# Patient Record
Sex: Female | Born: 1945 | Race: White | Hispanic: No | Marital: Married | Smoking: Never smoker
Health system: Southern US, Community
[De-identification: ages and names within clinical notes are randomized; demographics above are authoritative.]

## PROBLEM LIST (undated history)

## (undated) DIAGNOSIS — N189 Chronic kidney disease, unspecified: Secondary | ICD-10-CM

## (undated) DIAGNOSIS — R51 Headache: Secondary | ICD-10-CM

## (undated) DIAGNOSIS — B2790 Infectious mononucleosis, unspecified without complication: Secondary | ICD-10-CM

## (undated) DIAGNOSIS — R519 Headache, unspecified: Secondary | ICD-10-CM

## (undated) DIAGNOSIS — E785 Hyperlipidemia, unspecified: Secondary | ICD-10-CM

## (undated) DIAGNOSIS — E7212 Methylenetetrahydrofolate reductase deficiency: Secondary | ICD-10-CM

## (undated) DIAGNOSIS — R911 Solitary pulmonary nodule: Secondary | ICD-10-CM

## (undated) DIAGNOSIS — K219 Gastro-esophageal reflux disease without esophagitis: Secondary | ICD-10-CM

## (undated) DIAGNOSIS — J302 Other seasonal allergic rhinitis: Secondary | ICD-10-CM

## (undated) DIAGNOSIS — K5790 Diverticulosis of intestine, part unspecified, without perforation or abscess without bleeding: Secondary | ICD-10-CM

## (undated) DIAGNOSIS — R413 Other amnesia: Secondary | ICD-10-CM

## (undated) DIAGNOSIS — Z1589 Genetic susceptibility to other disease: Secondary | ICD-10-CM

## (undated) DIAGNOSIS — I2510 Atherosclerotic heart disease of native coronary artery without angina pectoris: Secondary | ICD-10-CM

## (undated) DIAGNOSIS — E079 Disorder of thyroid, unspecified: Secondary | ICD-10-CM

## (undated) HISTORY — DX: Disorder of thyroid, unspecified: E07.9

## (undated) HISTORY — PX: COLONOSCOPY: SHX174

## (undated) HISTORY — DX: Solitary pulmonary nodule: R91.1

## (undated) HISTORY — DX: Chronic kidney disease, unspecified: N18.9

## (undated) HISTORY — PX: CATARACT EXTRACTION, BILATERAL: SHX1313

## (undated) HISTORY — DX: Diverticulosis of intestine, part unspecified, without perforation or abscess without bleeding: K57.90

## (undated) HISTORY — DX: Other seasonal allergic rhinitis: J30.2

## (undated) HISTORY — PX: VAGINAL HYSTERECTOMY: SUR661

## (undated) HISTORY — DX: Headache: R51

## (undated) HISTORY — DX: Infectious mononucleosis, unspecified without complication: B27.90

## (undated) HISTORY — DX: Methylenetetrahydrofolate reductase deficiency: E72.12

## (undated) HISTORY — DX: Headache, unspecified: R51.9

## (undated) HISTORY — DX: Hyperlipidemia, unspecified: E78.5

## (undated) HISTORY — DX: Genetic susceptibility to other disease: Z15.89

## (undated) HISTORY — DX: Atherosclerotic heart disease of native coronary artery without angina pectoris: I25.10

## (undated) HISTORY — DX: Other amnesia: R41.3

## (undated) HISTORY — DX: Gastro-esophageal reflux disease without esophagitis: K21.9

## (undated) HISTORY — PX: CHOLECYSTECTOMY: SHX55

## (undated) HISTORY — PX: GALLBLADDER SURGERY: SHX652

---

## 1998-06-19 ENCOUNTER — Ambulatory Visit (HOSPITAL_COMMUNITY): Admission: RE | Admit: 1998-06-19 | Discharge: 1998-06-19 | Payer: Self-pay | Admitting: Family Medicine

## 1998-12-22 ENCOUNTER — Ambulatory Visit (HOSPITAL_COMMUNITY): Admission: RE | Admit: 1998-12-22 | Discharge: 1998-12-22 | Payer: Self-pay | Admitting: Family Medicine

## 1998-12-22 ENCOUNTER — Encounter: Payer: Self-pay | Admitting: Family Medicine

## 1999-10-30 ENCOUNTER — Ambulatory Visit (HOSPITAL_COMMUNITY): Admission: RE | Admit: 1999-10-30 | Discharge: 1999-10-30 | Payer: Self-pay | Admitting: Internal Medicine

## 1999-11-16 ENCOUNTER — Ambulatory Visit (HOSPITAL_COMMUNITY): Admission: RE | Admit: 1999-11-16 | Discharge: 1999-11-16 | Payer: Self-pay | Admitting: Internal Medicine

## 1999-11-16 ENCOUNTER — Encounter: Payer: Self-pay | Admitting: Internal Medicine

## 1999-11-25 ENCOUNTER — Encounter: Payer: Self-pay | Admitting: Obstetrics and Gynecology

## 1999-11-25 ENCOUNTER — Ambulatory Visit (HOSPITAL_COMMUNITY): Admission: RE | Admit: 1999-11-25 | Discharge: 1999-11-25 | Payer: Self-pay | Admitting: Obstetrics and Gynecology

## 2000-01-20 ENCOUNTER — Encounter (INDEPENDENT_AMBULATORY_CARE_PROVIDER_SITE_OTHER): Payer: Self-pay | Admitting: Specialist

## 2000-01-20 ENCOUNTER — Inpatient Hospital Stay (HOSPITAL_COMMUNITY): Admission: EM | Admit: 2000-01-20 | Discharge: 2000-01-22 | Payer: Self-pay | Admitting: Obstetrics and Gynecology

## 2001-02-01 ENCOUNTER — Other Ambulatory Visit: Admission: RE | Admit: 2001-02-01 | Discharge: 2001-02-01 | Payer: Self-pay | Admitting: Obstetrics and Gynecology

## 2002-04-17 ENCOUNTER — Other Ambulatory Visit: Admission: RE | Admit: 2002-04-17 | Discharge: 2002-04-17 | Payer: Self-pay | Admitting: Obstetrics and Gynecology

## 2003-05-20 ENCOUNTER — Ambulatory Visit: Admission: RE | Admit: 2003-05-20 | Discharge: 2003-05-20 | Payer: Self-pay | Admitting: Family Medicine

## 2003-05-22 ENCOUNTER — Ambulatory Visit (HOSPITAL_COMMUNITY): Admission: RE | Admit: 2003-05-22 | Discharge: 2003-05-22 | Payer: Self-pay | Admitting: Family Medicine

## 2003-05-22 ENCOUNTER — Encounter: Payer: Self-pay | Admitting: Family Medicine

## 2003-05-29 ENCOUNTER — Ambulatory Visit (HOSPITAL_COMMUNITY): Admission: RE | Admit: 2003-05-29 | Discharge: 2003-05-29 | Payer: Self-pay | Admitting: Family Medicine

## 2003-05-29 ENCOUNTER — Encounter: Payer: Self-pay | Admitting: Family Medicine

## 2004-02-13 ENCOUNTER — Ambulatory Visit (HOSPITAL_COMMUNITY): Admission: RE | Admit: 2004-02-13 | Discharge: 2004-02-13 | Payer: Self-pay | Admitting: Family Medicine

## 2005-02-10 ENCOUNTER — Ambulatory Visit (HOSPITAL_COMMUNITY): Admission: RE | Admit: 2005-02-10 | Discharge: 2005-02-10 | Payer: Self-pay | Admitting: Endocrinology

## 2005-09-16 ENCOUNTER — Ambulatory Visit (HOSPITAL_BASED_OUTPATIENT_CLINIC_OR_DEPARTMENT_OTHER): Admission: RE | Admit: 2005-09-16 | Discharge: 2005-09-16 | Payer: Self-pay | Admitting: Orthopedic Surgery

## 2007-06-28 ENCOUNTER — Ambulatory Visit: Payer: Self-pay | Admitting: Internal Medicine

## 2007-07-19 ENCOUNTER — Ambulatory Visit: Payer: Self-pay | Admitting: Internal Medicine

## 2007-07-19 DIAGNOSIS — K5730 Diverticulosis of large intestine without perforation or abscess without bleeding: Secondary | ICD-10-CM | POA: Insufficient documentation

## 2007-09-02 DIAGNOSIS — K602 Anal fissure, unspecified: Secondary | ICD-10-CM | POA: Insufficient documentation

## 2007-09-02 DIAGNOSIS — K625 Hemorrhage of anus and rectum: Secondary | ICD-10-CM | POA: Insufficient documentation

## 2007-09-02 DIAGNOSIS — Z8719 Personal history of other diseases of the digestive system: Secondary | ICD-10-CM | POA: Insufficient documentation

## 2010-12-22 NOTE — Assessment & Plan Note (Signed)
[facility]                         GASTROENTEROLOGY OFFICE NOTE   Rhonda, Reese                   MRN:          [id number]  DATE:06/28/2007                            DOB:          1946-07-16    Rhonda Reese is a 65 year old, white female who comes in today with  rectal pain and bleeding.  Anusol HC suppositories which she has taken  for the past week with some improvement of her symptoms.  We have known  Rhonda Reese for the past 20 years.  She had biliary pancreatitis and  cholecystectomy in 1992, with post ERCP of pancreatitis with complete  and spontaneous resolution.  She also had a screening colonoscopy in  March 2001, because of family history of colon cancer in her  grandparents and family history of colon polyps in her father.  She has  noticed intermittent rectal bleeding.  Her bowel habits otherwise have  been regular.   MEDICATIONS:  Multivitamin, Gingko-Biloba and Anusol HC suppositories  nightly.   PHYSICAL EXAMINATION:  VITAL SIGNS:  Blood pressure 124/72, pulse 72,  weight 192 pounds.  GENERAL:  She was in no distress.  LUNGS:  Clear to auscultation.  CARDIAC:  Normal S1, S2.  ABDOMEN:  Soft, relaxed, nontender with post cholecystectomy scar.  Normoactive bowel sounds.  RECTAL:  Tender anal canal with small, shallow fissure, 1+ internal  hemorrhoids, at least three of them rather bluish in discoloration and  edematous.  No active bleeding.  Stool was hemoccult negative.   IMPRESSION:  80. A 65 year old, white female with symptomatic hemorrhoids and small      anal fissure.  2. History of colon cancer in an indirect relative.  The patient is      due for repeat colonoscopy.  3. Status post remote cholecystectomy with post endoscopic retrograde      cholangiopancreatography pancreatitis.   PLAN:  1. Analpram cream 2.5% samples and prescription to use b.i.d.  2. Anusol HC suppositories continue nightly.  3.  Colonoscopy scheduled.     Hedwig Morton. Juanda Chance, MD  Electronically Signed    DMB/MedQ  DD: 06/28/2007  DT: 06/29/2007  Job #: [id number]   cc:   Holley Bouche, M.D.

## 2010-12-25 NOTE — H&P (Signed)
Rhonda Reese, BACCHI            ACCOUNT NO.:  [id number]   MEDICAL RECORD NO.:  [id number]          PATIENT TYPE:  AMB   LOCATION:  DSC                          FACILITY:  [facility]   PHYSICIAN:  Katy Fitch. Sypher, M.D. DATE OF BIRTH:  12-27-1945   DATE OF ADMISSION:  09/16/2005  DATE OF DISCHARGE:                                HISTORY & PHYSICAL   PREOPERATIVE DIAGNOSIS:  Chronic entrapment neuropathy, right median nerve  at carpal tunnel.   POSTOPERATIVE DIAGNOSIS:  Chronic entrapment neuropathy, right median nerve  at carpal tunnel.   OPERATION:  Release of right transcarpal ligament.   OPERATING SURGEON:  Josephine Igo, MD   ASSISTANT:  Annye Rusk, PAC.   ANESTHESIA:  General by LMA.   SUPERVISING ANESTHESIOLOGIST:  Dr. Michelle Piper   INDICATIONS:  Valinda Fedie is a 65 year old woman referred through the  courtesy of Dr. Holley Bouche.  She has a history of bilateral hand  numbness and discomfort.  She was sent to [facility]  in 2005 for elective diagnostic studies.  These documented bilateral carpal  tunnel syndrome.   Due to her failure to respond to nonoperative measures, she was referred for  a surgical consult.  Clinical examination confirmed bilateral carpal tunnel  syndrome.  We recommended proceeding with staged bilateral carpal tunnel  release.  After informed consent, she is brought to the operating room at  this time.   She is noted to have type 2 diabetes that is well controlled by diet.   After informed consent, she is brought to the operating room at this time.   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   PROCEDURE:  Preesha Benjamin is brought to the operating room and placed in  supine position on the operating room table.  Following the induction of  general anesthesia by LMA technique, the right arm was prepped with Betadine  surgical solution and sterilely draped.   Following exsanguination of the right arm  with an Esmarch bandage and  arterial tourniquet __________was inflated to 220 mmHg.   The procedure commenced with a short incision in the line of the ring finger  and the palm.  Subcutaneous tissue were carefully divided in the palmar  fascia.  This is split longitudinally to the encompass this branch of the  median nerve.   These are followed back to the transverse carpal ligament which is gently  isolated from the median nerve.   The ligament was released along its ulnar border, extending into the distal  forearm.   This widely opened the carpal canal.  No masses or other predicaments are  noted.   Bleeding points along the margin of the released ligament were  electrocauterized by bipolar current followed by repair of the skin with  intradermal 3-0 Prolene suture.   A compressive was applied with a volar plaster splint, maintaining the wrist  in 5 degrees of dorsiflexion.   PREOPERATIVE CARE:  She is provided a prescription for Percocet 5 mg 1 p.o.  q.4-6h. p.r.n. pain, 20 tabs without refill.   She will return to our office for follow up  evaluation in a week or sooner  p.r.n. with problems with the medication or splint.      Katy Fitch Sypher, M.D.  Electronically Signed     RVS/MEDQ  D:  09/16/2005  T:  09/16/2005  Job:  [id number]   cc:   Holley Bouche, M.D.  Fax: [phone]

## 2010-12-25 NOTE — Op Note (Signed)
[facility]  Patient:    Rhonda Reese, Rhonda Reese                   MRN: [id number] Proc. Date: 01/20/00 Adm. Date:  [id number] Attending:  Michaele Offer                           Operative Report  PREOPERATIVE DIAGNOSIS:  Pelvic pain and pelvic mass by ultrasound.  POSTOPERATIVE DIAGNOSIS:  Pelvic pain and pelvic adhesions.  PROCEDURE:  Laparoscopic right salpingo-oophorectomy and adhesiolysis followed by laparotomy for bleeding and left salpingo-oophorectomy.  SURGEON:  Zenaida Niece, M.D.  ASSISTANT:  Mayme Genta, P.A. (student)  ANESTHESIA:  General endotracheal tube.  ESTIMATED BLOOD LOSS:  700 cc.  FINDINGS:  Normal remaining fallopian tubes and ovaries. Her ascending colon was adherent to the right abdominal wall and she had a normal appearing appendix. Her omentum had one small adhesions to the left vaginal cuff. There was on evidence of pelvic mass.  COUNTS:  Correct.  CONDITION:  Stable.  DESCRIPTION OF PROCEDURE:  After appropriate informed consent was obtained, the patient was taken to the operating room and placed in the dorsal supine position. General anesthesia was induced and she was placed in low stirrups. Her abdomen was prepped and draped in the usual sterile fashion and a Foley catheter inserted. A sponge stick was inserted into the vagina for possible vaginal cuff manipulation. Her infraumbilical skin was then infiltrated with 0.25% Marcaine and a 3% horizontal incision was made. The fascia was identified and elevated with Kelly clamps and incised with scissors. The peritoneum was then entered bluntly. A pursestring suture with #0 Vicryl was placed around the fascia and the Hasson laparoscopic cannula was inserted. The abdomen was then insufflated with CO2 gas and the scope was inserted. Inspection with the laparoscope revealed the above mentioned findings. The operative scope was then inserted and 5 mm ports were  placed in the midline two finger breadths above the pubic symphysis and the left lower quadrant both under direct vision. The adhesions of the right ascending colon to the right abdominal wall were taken down sharply with scissors with adequate hemostasis to free up the bowel and move it out of the operating field. The appendix was identified and found to be normal. After further inspection this adhesions of the omentum to the left vaginal cuff was isolated. It was coagulated with bipolar cautery and transected to free up the omentum. The right tube and ovary were then able to be identified. The infundibulopelvic ligament was coagulated with bipolar cautery and transected. The mesovarium and mesosalpinx were then coagulated and transected all the way down to remove the ovary. At the distal end where the tube was attached to the peritoneum, I encountered some significant bleeding. This was done after the ovary was freed up. The ovary was placed in the cul-de-sac for later removal and was then moved to the middle portion of the left abdomen due to significant bleeding. I attempted to control this bleeding with bipolar cautery but was unable to adequately control this. The ureter had been previously identified and was well below this bleeding. Due to the inability to control the bleeding, we proceeded with a laparotomy.  Her abdomen was entered via a standard Pfannenstiel incision. There was noted to be a significant amount of blood in the abdomen. This was suctioned and irrigated out and the infundibulopelvic ligament and coagulation lines on the right  side were identified. Bleeding was seen to come from the most distal portion of this and was controlled with an Allis clamp. A suture of 3-0 Vicryl was then placed around this bleeding vessel which obtained hemostasis. The peritoneum in this area also showed some oozing and this was reapproximated with running 3-0 Vicryl to help maintain  hemostasis. Irrigation was performed and this area was found to be hemostatic. The bowels had been packed out of the operating field with moist lap pads. The left tube and ovary were then identified and isolated. The infundibulopelvic ligament was clamped with a Heaney clamp and transected. It was then doubly ligated with #0 Vicryl. The remainder of the tube and ovary were then clamped with a Heaney clamp and removed sharply. This pedicle was also ligated with #0 Vicryl. There was noted to be bleeding between these two pedicles and this was controlled with 1 further suture of #0 Vicryl. This achieved adequate hemostasis. The pelvis was irrigated and found to be hemostatic. I was unable to adequately visualize the right ureter at this time. On the left side, this tube and ovary appeared to be by palpation well above the left ureter. All lap pads were removed and the omentum was pulled down and the right ovary was identified and removed. This was removed previously laparoscopically. The subfascial space was found to be hemostatic. The fascia was closed in a running fashion starting at both ends and meeting in the middle with #0 Vicryl. The subcutaneous tissue was then irrigated and made hemostatic with electrocautery and the skin was closed with staples. The abdomen was then insufflated again with CO2 gas and the standard scope was inserted and confirmed hemostasis. The right ureter was identified and found to have peristalsis. I attempted to find the left ureter but due to distortion of the anatomy, I was unable to identify this. At this time, the Hasson cannula was removed and the fascia was closed with the previously placed pursestring suture. A 5 mm port had been placed previously in the left lower quadrant and this was removed under direct vision. The skin incisions were closed with subcuticular sutures followed by Steri-Strips and band-aids. The Pfannenstiel incision was then dressed  with sterile dressing. The patient was extubated in the operating room and tolerated the procedure well and was taken to the recovery room in stable condition. DD:  01/20/00 TD:  01/22/00 Job: [id number]

## 2010-12-25 NOTE — Discharge Summary (Signed)
[facility]  Patient:    Rhonda Reese, Rhonda Reese                   MRN: [id number] Adm. Date:  [id number] Disc. Date: [id number] Attending:  Michaele Offer                           Discharge Summary  ADMISSION DIAGNOSES:  Pelvic pain and a pelvic mass.  DISCHARGE DIAGNOSES:  Pelvic pain and pelvic adhesions.  PROCEDURE:  Laparoscopic right salpingo-oophorectomy and lysis of adhesions followed by laparotomy for bleeding and a left salpingo-oophorectomy.  COMPLICATIONS:  Bleeding necessitating a laparotomy.  CONSULTATIONS:  None.  HISTORY AND PHYSICAL:  Briefly, this is a 65 -year-old white female who, in a workup for abdominal pain by Dr. Lina Sar, was found to have a pelvic mass by CT scan and ultrasound.  This mass appeared to be at the vaginal cuff as well as some ovarian cyst.  Due to persistent pelvic pain and the inability to confirm exactly what this mass was, we agreed to proceed with surgical removal.  PAST HISTORY:  Significant for a history of hepatitis A and a history of pancreatitis secondary to gallbladder disease and she has had a cholecystectomy.  History of a kidney stone.  MEDICATIONS:  Claritin.  PHYSICAL EXAMINATION:  PELVIS / ABDOMEN:  Significant for a tender pelvis and no significant masses on pelvic or abdominal exam.  HOSPITAL COURSE:  The patient was admitted on the day of surgery and underwent the above-mentioned procedure under general anesthesia.  Estimated blood loss was approximately 700 cc.  Both tubes and ovaries were normal and she had a few pelvic adhesions.  There was no evidence of a pelvic mass so I cannot explain the appearance of this mass on ultrasound and CT scan. Postoperatively she did very well and was rapidly able to ambulate and tolerated a regular diet.  Postoperative hemoglobin was ordered but was not done and as the patient was hemodynamically stable this was not re-ordered. On the  evening of postoperative day number two her incision was healing well and at this time she had good pain control and was felt to be stable enough for discharge home.  CONDITION ON DISCHARGE:  Stable.  DISPOSITION:  Discharged to home.  DISCHARGE INSTRUCTIONS:  Her diet is regular, her activity is no strenuous activity and her follow up is in approximately 4-5 days for staple removal and medications are Percocet p.r.n. pain.  Final pathology report reveals a benign corpus luteum cyst and benign follicular cyst, benign paratubal cyst, no evidence of malignancy. DD:  02/05/00 TD:  02/05/00 Job: [id number]

## 2010-12-25 NOTE — H&P (Signed)
[facility]  Patient:    Rhonda Reese, Rhonda Reese                   MRN: [id number] Adm. Date:  [id number] Attending:  Michaele Offer                         History and Physical  CHIEF COMPLAINT:  Pelvic mass and pelvic pain.  HISTORY OF PRESENT ILLNESS:  This is a 65 year old white female, gravida 3, para 3-0-0-3, whom I initially saw in July of 2000.  At that time, she complained of intermittent bilateral lower quadrant pain on the left greater than the right that was unpredictable, lasted minutes to hours and went away on its own.  She reported normal bowel habits at that time and rare stress urinary incontinence.  She has had a previous vaginal hysterectomy for bleeding.  Exam at that time revealed no pelvic masses and mild bilateral adnexal tenderness.  She continued to have abdominal/pelvic pain and saw Dr. Hedwig Morton. Juanda Chance for this in April of this year.  In workup of this, she had a CT scan of her abdomen and pelvis.  The CT scan of the pelvis revealed what they thought was a prominent left ovary measuring 4 x 3 cm and a soft tissue density contiguous with the vaginal cuff.  She had an ultrasound to further evaluate this and the ultrasound revealed a prominent soft tissue area with multiple anechoic areas within it, slightly to the left of the midline.  The ultrasound report suggested that this may be the cervix but this is not the cervix, as she has had a vaginal hysterectomy.  It also reports an anechoic lesion adjacent to this which may represent a small cyst in the left ovary but discrete ovarian tissue was not well-identified.  Due to her persistent pain and this unusual-appearing mass at the vaginal cuff, she is being admitted for surgical removal.  PAST OBSTETRICAL HISTORY:  Significant for three vaginal deliveries at term, the most recent being in 1972.  PAST MEDICAL HISTORY:  Significant for environmental allergies and a history of  pancreatitis at the time of cholecystitis.  SURGICAL HISTORY:  Significant for laparoscopic cholecystectomy six years ago, at which time she also had pancreatitis, and the history of transvaginal hysterectomy.  ALLERGIES:  None known.  CURRENT MEDICATIONS:  Claritin.  FAMILY HISTORY:  Maternal grandfather with colon cancer.  Paternal grandmother with either ovarian or uterine cancer.  Paternal aunt and paternal uncle with colon polyps.  Recent diagnosis of mother with what sounds like primary peritoneal carcinoma, followed by Dr. Reuel Boom L. Clarke-Pearson and Dr. Jonny Ruiz T. Soper.  SOCIAL HISTORY:  Patient is married to [employer] of [employer] and [place] and patient denies alcohol, tobacco or drug use.  PHYSICAL EXAMINATION:  GENERAL:  She is a well-developed, well-nourished white female in no acute distress.  Last weight in the office was 171 pounds.  VITAL SIGNS:  Blood pressure was 110/80.  HEENT:  Pupils are equally round and reactive to light and accommodation and her extraocular muscles are intact.  Oropharynx is clear without erythema or exudate.  NECK:  Supple without lymphadenopathy or thyromegaly.  LUNGS:  Clear to auscultation.  HEART:  Regular rate and rhythm without murmur.  BACK:  No CVA tenderness.  BREASTS:  The most recent breast exam in the sitting and supine positions reveals no dominant masses, adenopathy, skin change or nipple discharge.  ABDOMEN:  Slightly tender in the left lower quadrant and midline without palpable masses.  EXTREMITIES:  No edema, are nontender and DTRs are 2/4 and symmetric.  PELVIC:  Speculum examination reveals a well-healed vaginal cuff and externally, she has no lesions.  On bimanual exam, she has a slight fullness and nodularity at the vaginal cuff, which is also tender at the cuff and to the left, but no palpable significant masses.  Rectovaginal exam confirms this.  ASSESSMENT:  Pelvic mass with pelvic pain.   This appears on ultrasound to be at the vaginal cuff, which they state is her cervix, though it is not.  Thus, she has an unknown pelvic mass.  Due to the possibility of carcinoma and the patients mothers recent history of primary peritoneal carcinoma, Dr. Reuel Boom L. Clarke-Pearson has been made aware of the situation.  PLAN:  Plan is to admit the patient for a possible laparoscopic bilateral salpingo-oophorectomy and removal of this mass.  I do not think that it is carcinoma; however, we are prepared to proceed with a laparotomy and staging procedure if this does turn out to be carcinoma.  Dr. Loree Fee will be on standby in case needed.  DD:  01/19/00 TD:  01/20/00 Job: [id number]

## 2011-03-19 ENCOUNTER — Other Ambulatory Visit: Payer: Self-pay | Admitting: Family Medicine

## 2011-03-19 DIAGNOSIS — R220 Localized swelling, mass and lump, head: Secondary | ICD-10-CM

## 2011-03-22 ENCOUNTER — Ambulatory Visit
Admission: RE | Admit: 2011-03-22 | Discharge: 2011-03-22 | Disposition: A | Discharge status: Home / Self Care | Payer: BC Managed Care – PPO | Source: Ambulatory Visit | Attending: Family Medicine | Admitting: Family Medicine

## 2011-03-22 DIAGNOSIS — R220 Localized swelling, mass and lump, head: Secondary | ICD-10-CM

## 2011-03-25 ENCOUNTER — Other Ambulatory Visit: Payer: Self-pay

## 2013-02-12 ENCOUNTER — Other Ambulatory Visit: Payer: Self-pay

## 2013-02-16 DIAGNOSIS — R109 Unspecified abdominal pain: Secondary | ICD-10-CM | POA: Diagnosis not present

## 2013-02-16 DIAGNOSIS — N949 Unspecified condition associated with female genital organs and menstrual cycle: Secondary | ICD-10-CM | POA: Diagnosis not present

## 2013-02-16 DIAGNOSIS — Z1231 Encounter for screening mammogram for malignant neoplasm of breast: Secondary | ICD-10-CM | POA: Diagnosis not present

## 2013-02-20 DIAGNOSIS — R928 Other abnormal and inconclusive findings on diagnostic imaging of breast: Secondary | ICD-10-CM | POA: Diagnosis not present

## 2013-02-23 DIAGNOSIS — R7989 Other specified abnormal findings of blood chemistry: Secondary | ICD-10-CM | POA: Diagnosis not present

## 2013-02-23 DIAGNOSIS — R5381 Other malaise: Secondary | ICD-10-CM | POA: Diagnosis not present

## 2013-02-23 DIAGNOSIS — R5383 Other fatigue: Secondary | ICD-10-CM | POA: Diagnosis not present

## 2013-02-23 DIAGNOSIS — D518 Other vitamin B12 deficiency anemias: Secondary | ICD-10-CM | POA: Diagnosis not present

## 2013-03-09 DIAGNOSIS — R5381 Other malaise: Secondary | ICD-10-CM | POA: Diagnosis not present

## 2013-03-09 DIAGNOSIS — R7989 Other specified abnormal findings of blood chemistry: Secondary | ICD-10-CM | POA: Diagnosis not present

## 2013-03-09 DIAGNOSIS — R5383 Other fatigue: Secondary | ICD-10-CM | POA: Diagnosis not present

## 2013-03-12 ENCOUNTER — Other Ambulatory Visit: Payer: Self-pay | Admitting: Nurse Practitioner

## 2013-03-12 DIAGNOSIS — E7210 Disorders of sulfur-bearing amino-acid metabolism, unspecified: Secondary | ICD-10-CM

## 2013-03-12 DIAGNOSIS — R6889 Other general symptoms and signs: Secondary | ICD-10-CM

## 2013-03-14 ENCOUNTER — Other Ambulatory Visit: Payer: Self-pay | Admitting: Nurse Practitioner

## 2013-03-14 ENCOUNTER — Ambulatory Visit
Admission: RE | Admit: 2013-03-14 | Discharge: 2013-03-14 | Disposition: A | Discharge status: Home / Self Care | Payer: Medicare Other | Source: Ambulatory Visit | Attending: Nurse Practitioner | Admitting: Nurse Practitioner

## 2013-03-14 DIAGNOSIS — E7210 Disorders of sulfur-bearing amino-acid metabolism, unspecified: Secondary | ICD-10-CM

## 2013-03-14 DIAGNOSIS — Z77018 Contact with and (suspected) exposure to other hazardous metals: Secondary | ICD-10-CM

## 2013-03-14 DIAGNOSIS — R6889 Other general symptoms and signs: Secondary | ICD-10-CM

## 2013-03-14 DIAGNOSIS — Z135 Encounter for screening for eye and ear disorders: Secondary | ICD-10-CM | POA: Diagnosis not present

## 2013-03-14 DIAGNOSIS — R51 Headache: Secondary | ICD-10-CM | POA: Diagnosis not present

## 2013-03-16 ENCOUNTER — Other Ambulatory Visit: Payer: Self-pay

## 2013-03-28 ENCOUNTER — Ambulatory Visit (INDEPENDENT_AMBULATORY_CARE_PROVIDER_SITE_OTHER): Payer: Medicare Other | Admitting: Neurology

## 2013-03-28 ENCOUNTER — Encounter: Payer: Self-pay | Admitting: Neurology

## 2013-03-28 VITALS — BP 141/83 | HR 77 | Ht 62.0 in | Wt 187.0 lb

## 2013-03-28 DIAGNOSIS — R413 Other amnesia: Secondary | ICD-10-CM | POA: Diagnosis not present

## 2013-03-28 NOTE — Progress Notes (Signed)
[facility]  PATIENT: Rhonda Reese DOB: 04-03-1946  HISTORICAL Doreather is a 67 years old right-handed Caucasian female, referred by her primary care physician Dr. Durwin Nora, and Dr. 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 Over the past 6 months, she noticed more memory trouble, has to write down details to remind, difficulty thinking, forgetful after staff meeting.  She also complains of six-months history of worsening hair loss, she is on multiple natural supplements, including B complex.   She had a history of migraine headaches, only had a few typical severe migraine headaches, but over past 6 months, she has more frequent headaches, left parietal region, pressure, numbness sensation.  She had received MRA. of the brain in August 2014 at [facility] imaging, we reviewed the films together, motion degraded artifact, mild small vessel disease, no significant moderate to large vessel disease,    Her paternal grandmother suffered Alzheimer's disease in her nineties, her paternal  grandfather suffered liver cancer at age 25.   . Diabetes Father   . Thyroid disease Mother   . Cancer Mother   . Hemochromatosis Mother     SOCIAL HISTORY:  History   Social History  . Marital Status: Married    Spouse Name: Tom    Number of Children: 3  . Years of Education: 12   Occupational History    [employer]   Social History Main Topics  . Smoking status: Never Smoker   . Smokeless tobacco: Never Used  . Alcohol Use: No  . Drug Use: No  . Sexual Activity: Not on file   Other Topics Concern  . Not on file   Social History Narrative   Patient lives at home with her husband [name]). Patient is in ministry. Patient has high school education.   Right handed.   Caffeine-two cups daily      PHYSICAL EXAM    Filed Vitals:   03/28/13 0931  Height: 5\' 2"  (1.575 m)  Weight: 187 lb (84.823 kg)   Body mass index is 34.19 kg/(m^2).   Generalized: In no acute distress  Neck: Supple, no carotid bruits   Cardiac: Regular rate rhythm  Pulmonary: Clear to auscultation bilaterally  Musculoskeletal: No deformity  Neurological examination  Mentation: Alert oriented to time, place, history taking, and causual conversation, MMSE 29/30, she missed 1/3 recall  Cranial nerve II-XII: Pupils were equal round reactive to light extraocular movements were full, visual field were full on confrontational test. facial sensation and strength were normal. hearing was intact to finger rubbing bilaterally. Uvula tongue midline.  head turning and shoulder shrug and were normal and symmetric.Tongue protrusion into cheek strength was  normal.  Motor: normal tone, bulk and strength.  Sensory: Intact to fine touch, pinprick, preserved vibratory sensation, and proprioception at toes.  Coordination: Normal finger to nose, heel-to-shin bilaterally there was no truncal ataxia  Gait: Rising up from seated position without assistance, normal stance, without trunk ataxia, moderate stride, good  arm swing, smooth turning, able to perform tiptoe, and heel walking without difficulty.   Romberg signs: Negative  Deep tendon reflexes: Brachioradialis 2/2, biceps 2/2, triceps 2/2, patellar 2/2, Achilles 2/2, plantar responses were flexor bilaterally.   DIAGNOSTIC DATA (LABS, IMAGING, TESTING) - I reviewed patient records, labs, notes, testing and imaging myself where available.   ASSESSMENT AND PLAN   67 years old right-handed Caucasian female, with past medical history of hemochromatosis, complains of gradual onset memory trouble. Mini-Mental Status Examination is 29 out of 30   1. Differentiation diagnosis  including age related process, stress  vs. early mild cognitive impairment  2 .complete evaluation with MRI of brain  3   Her frequent headaches has some migraine features, I have suggested magnesium oxide and riboflavin supplement   Levert Feinstein, M.D. Ph.D.  [facility] [address], [address] [place], [place] [address] [phone]

## 2013-03-29 DIAGNOSIS — D235 Other benign neoplasm of skin of trunk: Secondary | ICD-10-CM | POA: Diagnosis not present

## 2013-04-04 DIAGNOSIS — R413 Other amnesia: Secondary | ICD-10-CM

## 2013-04-05 ENCOUNTER — Ambulatory Visit: Payer: Medicare Other | Admitting: Neurology

## 2013-04-06 ENCOUNTER — Other Ambulatory Visit: Payer: Self-pay | Admitting: Diagnostic Neuroimaging

## 2013-04-06 DIAGNOSIS — R413 Other amnesia: Secondary | ICD-10-CM

## 2013-04-06 NOTE — Progress Notes (Signed)
Quick Note:  I have called her, there is mild age related changes on MRI brain, will have further discussion on return visit. ______

## 2013-05-23 DIAGNOSIS — R7989 Other specified abnormal findings of blood chemistry: Secondary | ICD-10-CM | POA: Diagnosis not present

## 2013-05-23 DIAGNOSIS — R5381 Other malaise: Secondary | ICD-10-CM | POA: Diagnosis not present

## 2013-05-23 DIAGNOSIS — D509 Iron deficiency anemia, unspecified: Secondary | ICD-10-CM | POA: Diagnosis not present

## 2013-06-14 ENCOUNTER — Other Ambulatory Visit: Payer: Self-pay

## 2013-07-13 ENCOUNTER — Ambulatory Visit (INDEPENDENT_AMBULATORY_CARE_PROVIDER_SITE_OTHER): Payer: Medicare Other | Admitting: Neurology

## 2013-07-13 ENCOUNTER — Encounter: Payer: Self-pay | Admitting: Neurology

## 2013-07-13 DIAGNOSIS — R413 Other amnesia: Secondary | ICD-10-CM | POA: Diagnosis not present

## 2013-07-13 DIAGNOSIS — K625 Hemorrhage of anus and rectum: Secondary | ICD-10-CM

## 2013-07-13 DIAGNOSIS — Z8719 Personal history of other diseases of the digestive system: Secondary | ICD-10-CM

## 2013-07-13 DIAGNOSIS — K602 Anal fissure, unspecified: Secondary | ICD-10-CM

## 2013-07-13 NOTE — Progress Notes (Signed)
[facility]  PATIENT: Rhonda Reese DOB: 1946/07/06  HISTORICAL Rhonda Reese is a 67 years old right-handed Caucasian female, referred by her primary care physician Dr. Durwin Nora, and Dr. 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 Over the past 6 months, she noticed more memory trouble, has to write down details to remind, difficulty thinking, forgetful after staff meeting.  She also complains of six-months history of worsening hair loss, she is on multiple natural supplements, including B complex.   She had a history of migraine headaches, only had a few typical severe migraine headaches, but over past 6 months, she has more frequent headaches, left parietal region, pressure, numbness sensation.  She had received MRI of the brain in August 2014 at [facility] imaging, we reviewed the films together, motion degraded artifact, mild small vessel disease, no significant moderate to large vessel disease,    Her paternal grandmother suffered Alzheimer's disease in her nineties, her paternal grandfather  suffered liver cancer at age 62.  UPDATE 07/13/2013:  She is still very active at her ministry work, reported 60% improvement since her last visit in August 2014, MRI  Brain was essentially normal. She still complains of intermittent pressure headaches, especially left parietal region, she never has taken magnesium oxide, or riboflavin, she denies visual loss, she denies lateralized motor or sensory deficit,   . Diabetes Father   . Thyroid disease Mother   . Cancer Mother   . Hemochromatosis Mother     SOCIAL HISTORY:  History   Social History  . Marital Status: Married    Spouse Name: Rhonda Reese    Number of Children: 3  . Years of Education: 12   Occupational History    [employer]   Social History Main Topics  . Smoking status: Never Smoker   . Smokeless tobacco: Never Used  . Alcohol Use: No  . Drug Use: No  . Sexual Activity: Not on file   Other Topics Concern  . Not on file   Social History Narrative   Patient lives at home with her husband Rhonda Reese). Patient is in ministry. Patient has high school education.   Right handed.   Caffeine-two cups daily      PHYSICAL EXAM    Filed Vitals:   03/28/13 0931  Height: 5\' 2"  (1.575 m)  Weight: 187 lb (84.823 kg)   Body mass index is 34.19 kg/(m^2).   Generalized: In no acute distress  Neck: Supple, no carotid bruits   Cardiac: Regular rate rhythm  Pulmonary: Clear to auscultation bilaterally  Musculoskeletal: No deformity  Neurological examination  Mentation: Alert oriented to time, place, history taking, and causual conversation, MMSE 30 out of  30  Cranial nerve II-XII: Pupils were equal round reactive to light extraocular movements were full, visual field were full on confrontational test. facial sensation and strength were normal. hearing was intact to finger rubbing bilaterally. Uvula tongue midline.  head turning and shoulder shrug and were normal and symmetric.Tongue protrusion into cheek strength was  normal.  Motor: normal tone, bulk and strength.  Sensory: Intact to fine touch, pinprick, preserved vibratory sensation, and proprioception at toes.  Coordination: Normal finger to nose, heel-to-shin bilaterally there was no truncal ataxia  Gait: Rising up from seated position without assistance, normal stance, without trunk ataxia, moderate stride, good arm swing, smooth turning, able to perform tiptoe, and heel walking without difficulty.   Romberg signs: Negative  Deep tendon reflexes: Brachioradialis 2/2, biceps 2/2, triceps 2/2, patellar 2/2, Achilles 2/2, plantar responses were flexor bilaterally.   DIAGNOSTIC DATA (LABS, IMAGING, TESTING) - I reviewed patient records, labs, notes, testing and imaging myself where available.   ASSESSMENT AND PLAN   67 years old right-handed Caucasian female, with past medical history of hemochromatosis, complains of gradual onset memory trouble. Mini-Mental Status Examination is 30 out of 30   1. Differentiation diagnosis  including age related process, stress   2  Her frequent headaches has some migraine features, I have suggested magnesium oxide and riboflavin supplement  3. RTC in 6 months  Levert Feinstein, M.D. Ph.D.  [facility] [address], [address] [place], [place] [address] [phone]

## 2013-07-13 NOTE — Patient Instructions (Signed)
Magnesium oxide 400 mg twice a day,  Riboflavin 100 mg twice a day 

## 2013-08-07 DIAGNOSIS — D509 Iron deficiency anemia, unspecified: Secondary | ICD-10-CM | POA: Diagnosis not present

## 2013-08-07 DIAGNOSIS — R7989 Other specified abnormal findings of blood chemistry: Secondary | ICD-10-CM | POA: Diagnosis not present

## 2013-08-07 DIAGNOSIS — R5381 Other malaise: Secondary | ICD-10-CM | POA: Diagnosis not present

## 2013-08-30 DIAGNOSIS — R928 Other abnormal and inconclusive findings on diagnostic imaging of breast: Secondary | ICD-10-CM | POA: Diagnosis not present

## 2013-08-30 DIAGNOSIS — N6489 Other specified disorders of breast: Secondary | ICD-10-CM | POA: Diagnosis not present

## 2013-09-04 DIAGNOSIS — D249 Benign neoplasm of unspecified breast: Secondary | ICD-10-CM | POA: Diagnosis not present

## 2013-09-04 DIAGNOSIS — N6019 Diffuse cystic mastopathy of unspecified breast: Secondary | ICD-10-CM | POA: Diagnosis not present

## 2013-09-04 DIAGNOSIS — N6089 Other benign mammary dysplasias of unspecified breast: Secondary | ICD-10-CM | POA: Diagnosis not present

## 2013-09-04 DIAGNOSIS — Z0189 Encounter for other specified special examinations: Secondary | ICD-10-CM | POA: Diagnosis not present

## 2013-10-17 DIAGNOSIS — R7989 Other specified abnormal findings of blood chemistry: Secondary | ICD-10-CM | POA: Diagnosis not present

## 2013-10-17 DIAGNOSIS — R5383 Other fatigue: Secondary | ICD-10-CM | POA: Diagnosis not present

## 2013-10-17 DIAGNOSIS — R5381 Other malaise: Secondary | ICD-10-CM | POA: Diagnosis not present

## 2013-10-31 DIAGNOSIS — R079 Chest pain, unspecified: Secondary | ICD-10-CM | POA: Diagnosis not present

## 2013-10-31 DIAGNOSIS — Z7189 Other specified counseling: Secondary | ICD-10-CM | POA: Diagnosis not present

## 2014-01-07 ENCOUNTER — Ambulatory Visit: Payer: Medicare Other | Admitting: Neurology

## 2014-01-11 ENCOUNTER — Encounter: Payer: Self-pay | Admitting: Neurology

## 2014-01-11 ENCOUNTER — Ambulatory Visit (INDEPENDENT_AMBULATORY_CARE_PROVIDER_SITE_OTHER): Payer: Medicare Other | Admitting: Neurology

## 2014-01-11 DIAGNOSIS — R413 Other amnesia: Secondary | ICD-10-CM

## 2014-01-11 NOTE — Progress Notes (Signed)
[facility]  PATIENT: Rhonda Reese DOB: 05/09/1946  HISTORICAL  Rhonda Reese is a 68 years old right-handed Caucasian female, referred by her primary care physician Dr. Mickel Reese, and Dr. 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 Over the past 6 months, she noticed more memory trouble, has to write down details to remind, difficulty thinking, forgetful after staff meeting.  She also complains of six-months history of worsening hair loss, she is on multiple natural supplements, including B complex.   She had a history of migraine headaches, only had a few typical severe migraine headaches, but over past 6 months, she has more frequent headaches, left parietal region, pressure, numbness sensation.  She had received MRA. of the brain in August 2014 at [facility] imaging, we reviewed the films together, motion degraded artifact, mild small vessel disease, no significant moderate to large vessel disease,    Her paternal grandmother suffered Alzheimer's disease in her nineties, her paternal  grandfather suffered liver cancer at age 28.  UPDATE June 5th 2015: She is taking riboflavin 100mg  bid, but never taking magnesium, her headache has improved now, she complains of left parietal area sharp pain, sore afterwards, most noticeable when she rubs it.  Her memory has improved too, hemachromatosis seems improved as well, she has phlebotomy every 3 months, which has been helpful.    . Diabetes Father   . Thyroid disease Mother   . Cancer Mother   . Hemochromatosis Mother     SOCIAL HISTORY:  History   Social History  . Marital Status: Married    Spouse Name: Rhonda Reese    Number of Children: 3  . Years of Education: 12   Occupational History    [employer]   Social History Main Topics  . Smoking status: Never Smoker   . Smokeless tobacco: Never Used  . Alcohol Use: No  . Drug Use: No  . Sexual Activity: Not on file   Other Topics Concern  . Not on file   Social History Narrative   Patient lives at home with her husband Rhonda Reese). Patient is in ministry. Patient has high school education.   Right handed.   Caffeine-two cups daily      PHYSICAL EXAM    Filed Vitals:   03/28/13 0931  Height: 5\' 2"  (1.575 m)  Weight: 187 lb (84.823 kg)   Body mass index is 34.19 kg/(m^2).   Generalized: In no acute distress  Neck: Supple, no carotid bruits   Cardiac: Regular rate rhythm  Pulmonary: Clear to auscultation bilaterally  Musculoskeletal: No deformity  Neurological examination  Mentation: Alert oriented to time, place, history taking, and causual conversation, MMSE 29/30, she  missed 1/3 recall  Cranial nerve II-XII: Pupils were equal round reactive to light extraocular movements were full, visual field were full on confrontational test. facial sensation and strength were normal. hearing was intact to finger rubbing bilaterally. Uvula tongue midline.  head turning and shoulder shrug and were normal and symmetric.Tongue protrusion into cheek strength was  normal.  Motor: normal tone, bulk and strength.  Sensory: Intact to fine touch, pinprick, preserved vibratory sensation, and proprioception at toes.  Coordination: Normal finger to nose, heel-to-shin bilaterally there was no truncal ataxia  Gait: Rising up from seated position without assistance, normal stance, without trunk ataxia, moderate stride, good arm swing, smooth turning, able to perform tiptoe, and heel walking without difficulty.   Romberg signs: Negative  Deep tendon reflexes: Brachioradialis 2/2, biceps 2/2, triceps 2/2, patellar 2/2, Achilles 2/2, plantar responses were flexor bilaterally.   DIAGNOSTIC DATA (LABS, IMAGING, TESTING) - I reviewed patient records, labs, notes, testing and imaging myself where available.   ASSESSMENT AND PLAN   68 years old right-handed Caucasian female, with past medical history of hemochromatosis, complains of gradual onset memory trouble. Mini-Mental Status Examination is 29 out of 30   1. Differentiation diagnosis  including age related process, stress  vs. early mild cognitive impairment  2   Her frequent headaches has some migraine features, keep riboflavin, vitamin supplement  3. Return to clinic as needed.  Rhonda Reese, M.D. Ph.D.  [facility] [address] [place], [place] [address] [phone]

## 2014-01-22 DIAGNOSIS — R7309 Other abnormal glucose: Secondary | ICD-10-CM | POA: Diagnosis not present

## 2014-03-05 DIAGNOSIS — N6019 Diffuse cystic mastopathy of unspecified breast: Secondary | ICD-10-CM | POA: Diagnosis not present

## 2014-04-26 ENCOUNTER — Telehealth: Payer: Self-pay | Admitting: *Deleted

## 2014-04-26 NOTE — Telephone Encounter (Signed)
I have called her, she has been complaining of few days history of headaches, she also has blurry vision, quick pain behind her eye balls.  In August 2014,  MRI brain showed small vessel disease,  MRA showed mild irregularity, no evidence of aneurysm.   She has history of hemochromatosis, there was no evidence of aneurysm by previous MRA brain.  Her described headache has migraine features, has tried excerdrine migraine with only temporary improve

## 2014-04-26 NOTE — Telephone Encounter (Signed)
Called and spoke to Patient she is very worried because she is having a lot of headaches and pain behind her eyes. Patient states she would like to come in her and her husband Maybe another needs to be done. Dr.Yan scheduled Patient a follow with you this coming Monday.

## 2014-04-29 ENCOUNTER — Ambulatory Visit: Payer: Self-pay | Admitting: Neurology

## 2014-04-29 ENCOUNTER — Telehealth: Payer: Self-pay | Admitting: Neurology

## 2014-04-29 DIAGNOSIS — R202 Paresthesia of skin: Secondary | ICD-10-CM

## 2014-04-29 DIAGNOSIS — R51 Headache: Principal | ICD-10-CM

## 2014-04-29 DIAGNOSIS — R519 Headache, unspecified: Secondary | ICD-10-CM

## 2014-04-29 DIAGNOSIS — R413 Other amnesia: Secondary | ICD-10-CM

## 2014-04-29 NOTE — Telephone Encounter (Signed)
Patient calling to cancel her appointment today and states that she wants to have an MRI done per phone discussion with Dr. Krista Blue, please return call and advise.

## 2014-04-30 DIAGNOSIS — R519 Headache, unspecified: Secondary | ICD-10-CM | POA: Insufficient documentation

## 2014-04-30 DIAGNOSIS — R51 Headache: Secondary | ICD-10-CM

## 2014-04-30 DIAGNOSIS — R202 Paresthesia of skin: Secondary | ICD-10-CM | POA: Insufficient documentation

## 2014-04-30 NOTE — Telephone Encounter (Signed)
Called and spoke to patient she wants to go forward with MRI.

## 2014-04-30 NOTE — Telephone Encounter (Signed)
She still has frequent headache, tingling sensation, more on left side, somewhat on the right side. Occasionally piercing pain.  She worries about the possibility of brain aneurysm, desire further evaluation.  Her insurance would not approve for MRA of brain.  Hinton Dyer, please call her for a follow up appt.

## 2014-04-30 NOTE — Telephone Encounter (Signed)
Called and scheduled patient for appt.

## 2014-05-02 ENCOUNTER — Encounter: Payer: Self-pay | Admitting: Neurology

## 2014-05-02 ENCOUNTER — Ambulatory Visit (INDEPENDENT_AMBULATORY_CARE_PROVIDER_SITE_OTHER): Payer: Medicare Other | Admitting: Neurology

## 2014-05-02 ENCOUNTER — Other Ambulatory Visit: Payer: Self-pay | Admitting: Neurology

## 2014-05-02 VITALS — BP 143/81 | HR 76 | Ht 62.0 in | Wt 176.0 lb

## 2014-05-02 DIAGNOSIS — G44229 Chronic tension-type headache, not intractable: Secondary | ICD-10-CM | POA: Diagnosis not present

## 2014-05-02 DIAGNOSIS — R7989 Other specified abnormal findings of blood chemistry: Secondary | ICD-10-CM

## 2014-05-02 DIAGNOSIS — R202 Paresthesia of skin: Secondary | ICD-10-CM

## 2014-05-02 DIAGNOSIS — R209 Unspecified disturbances of skin sensation: Secondary | ICD-10-CM | POA: Diagnosis not present

## 2014-05-02 DIAGNOSIS — R519 Headache, unspecified: Secondary | ICD-10-CM | POA: Insufficient documentation

## 2014-05-02 DIAGNOSIS — R413 Other amnesia: Secondary | ICD-10-CM

## 2014-05-02 DIAGNOSIS — G44221 Chronic tension-type headache, intractable: Secondary | ICD-10-CM

## 2014-05-02 DIAGNOSIS — H2510 Age-related nuclear cataract, unspecified eye: Secondary | ICD-10-CM | POA: Diagnosis not present

## 2014-05-02 DIAGNOSIS — R51 Headache: Secondary | ICD-10-CM

## 2014-05-02 MED ORDER — GABAPENTIN 100 MG PO CAPS: ORAL_CAPSULE | ORAL | Status: DC

## 2014-05-02 NOTE — Progress Notes (Signed)
[facility]  PATIENT: Rhonda Reese DOB: 05-Nov-1945  HISTORICAL  Rainah is a 68 years old right-handed Caucasian female, referred by her primary care physician Dr. Mickel Crow, and Dr. 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 Over the past 6 months, she noticed more memory trouble, has to write down details to remind, difficulty thinking, forgetful after staff meeting.  She also complains of six-months history of worsening hair loss, she is on multiple natural supplements, including B complex.   She had a history of migraine headaches, only had a few typical severe migraine headaches, but over past 6 months, she has more frequent headaches, left parietal region, pressure, numbness sensation.  She had received MRA. of the brain in August 2014 at [facility] imaging, we reviewed the films together, motion degraded artifact, mild small vessel disease, no significant moderate to large vessel disease,    Her maternal grandmother suffered Alzheimer's disease in her later 78s, her paternal  grandfather suffered liver cancer at age 56.  UPDATE June 5th 2015: She is taking riboflavin 100mg  bid, but never taking magnesium, her headache has improved now, she complains of left parietal area sharp pain, sore afterwards, most noticeable when she rubs it.  Her memory has improved too, hemachromatosis seems improved as well, she has phlebotomy every 3 months, which has been helpful.  UPDATE Sept 24th 2015: She came in today, complains few weeks history of almost persistent left parietal area low-pressure headaches, numbness tingling sensation, difficulty sleeping, fatigue, bandlike sensation around her vetex region, she denies visual loss, no dysarthria, no lateralized motor or sensory deficit.  We have reviewed MRI of the brain, only mild small vessel disease, MRA of the brain showed no large vessel disease. No evidence of aneurysm  She denies chewing difficulty,    SOCIAL HISTORY:  History   Social History  . Marital Status: Married    Spouse Name: Tom    Number of Children: 3  . Years of Education: 12   Occupational History    [employer]   Social History Main Topics  . Smoking status: Never Smoker   . Smokeless tobacco: Never Used  . Alcohol Use: No  . Drug Use: No  . Sexual Activity: Not on file   Other Topics Concern  . Not on file   Social History Narrative   Patient lives at home with her husband Gershon Mussel). Patient is in ministry. Patient has high school education.   Right handed.   Caffeine-two cups daily      PHYSICAL EXAM    Filed Vitals:   03/28/13 0931  Height: 5\' 2"  (1.575 m)  Weight: 187 lb (84.823 kg)  Body mass index is 34.19 kg/(m^2).   Generalized: In no acute distress  Neck: Supple, no carotid bruits   Cardiac: Regular rate rhythm  Pulmonary: Clear to auscultation bilaterally  Musculoskeletal: No deformity  Neurological examination  Mentation: Alert oriented to time, place, history taking, and causual conversation, MMSE  30/30  Cranial nerve II-XII: Pupils were equal round reactive to light extraocular movements were full, visual field were full on confrontational test. facial sensation and strength were normal. hearing was intact to finger rubbing bilaterally. Uvula tongue midline.  head turning and shoulder shrug and were normal and symmetric.Tongue protrusion into cheek strength was normal.  Motor: normal tone, bulk and strength.  Sensory: Intact to fine touch, pinprick, preserved vibratory sensation, and proprioception at toes.  Coordination: Normal finger to nose, heel-to-shin bilaterally there was no truncal ataxia  Gait: Rising up from seated position without assistance, normal stance, without trunk ataxia, moderate stride, good arm swing, smooth turning, able to perform tiptoe, and heel walking without difficulty.   Romberg signs: Negative  Deep tendon reflexes: Brachioradialis 2/2, biceps 2/2, triceps 2/2, patellar 2/2, Achilles 2/2, plantar responses were flexor bilaterally.   DIAGNOSTIC DATA (LABS, IMAGING, TESTING) - I reviewed patient records, labs, notes, testing and imaging myself where available.   ASSESSMENT AND PLAN   68 years old right-handed Caucasian female, with past medical history of hemochromatosis, complains of frequent left-sided headaches, history of migraine headaches,  Differentiation diagnosis including migraine variant, vs. stress induced tension headaches, previous evaluation failed to demonstrate structural abnormality. I do not think further imaging studies needed at this point. Will try low-dose Neurontin 100 mg 1-3 tablets every night Laboratory evaluations to rule out thyroid dysfunction, electrolyte imbalance, temporal arteritis. I will call her report,    Marcial Pacas, M.D. Ph.D.  [facility] [address] [place], [place] [address] [phone]

## 2014-05-03 ENCOUNTER — Telehealth: Payer: Self-pay | Admitting: Neurology

## 2014-05-03 LAB — CBC
HCT: 46.4 % (ref 34.0–46.6)
Hemoglobin: 15.8 g/dL (ref 11.1–15.9)
MCH: 30.6 pg (ref 26.6–33.0)
MCHC: 34.1 g/dL (ref 31.5–35.7)
MCV: 90 fL (ref 79–97)
Platelets: 334 10*3/uL (ref 150–379)
RBC: 5.17 x10E6/uL (ref 3.77–5.28)
RDW: 13 % (ref 12.3–15.4)
WBC: 8.1 10*3/uL (ref 3.4–10.8)

## 2014-05-03 LAB — COMPREHENSIVE METABOLIC PANEL
ALT: 30 IU/L (ref 0–32)
AST: 24 IU/L (ref 0–40)
Albumin/Globulin Ratio: 2.1 (ref 1.1–2.5)
Albumin: 4.6 g/dL (ref 3.6–4.8)
Alkaline Phosphatase: 89 IU/L (ref 39–117)
BUN/Creatinine Ratio: 14 (ref 11–26)
BUN: 13 mg/dL (ref 8–27)
CO2: 23 mmol/L (ref 18–29)
Calcium: 10.1 mg/dL (ref 8.7–10.3)
Chloride: 99 mmol/L (ref 97–108)
Creatinine, Ser: 0.91 mg/dL (ref 0.57–1.00)
GFR calc Af Amer: 76 mL/min/{1.73_m2} (ref >59)
GFR calc non Af Amer: 66 mL/min/{1.73_m2} (ref >59)
Globulin, Total: 2.2 g/dL (ref 1.5–4.5)
Glucose: 109 mg/dL — ABNORMAL HIGH (ref 65–99)
Potassium: 4.3 mmol/L (ref 3.5–5.2)
Sodium: 145 mmol/L — ABNORMAL HIGH (ref 134–144)
Total Bilirubin: 0.4 mg/dL (ref 0.0–1.2)
Total Protein: 6.8 g/dL (ref 6.0–8.5)

## 2014-05-03 LAB — THYROID PANEL WITH TSH
Free Thyroxine Index: 2 (ref 1.2–4.9)
T3 Uptake Ratio: 22 % — ABNORMAL LOW (ref 24–39)
T4, Total: 9.1 ug/dL (ref 4.5–12.0)
TSH: 1.15 u[IU]/mL (ref 0.450–4.500)

## 2014-05-03 LAB — C-REACTIVE PROTEIN: CRP: 6.5 mg/L — ABNORMAL HIGH (ref 0.0–4.9)

## 2014-05-03 LAB — SEDIMENTATION RATE: Sed Rate: 4 mm/hr (ref 0–40)

## 2014-05-03 NOTE — Telephone Encounter (Signed)
I have called her, there was no significant abnormalities on lab.  She has got gabapentin, but worried about side effect.   She will try to rest well, if still has headache, I advise her to try low dose gabapentin

## 2014-05-20 DIAGNOSIS — H40013 Open angle with borderline findings, low risk, bilateral: Secondary | ICD-10-CM | POA: Diagnosis not present

## 2014-07-12 DIAGNOSIS — H2511 Age-related nuclear cataract, right eye: Secondary | ICD-10-CM | POA: Diagnosis not present

## 2014-07-12 DIAGNOSIS — H25011 Cortical age-related cataract, right eye: Secondary | ICD-10-CM | POA: Diagnosis not present

## 2014-07-12 DIAGNOSIS — H2512 Age-related nuclear cataract, left eye: Secondary | ICD-10-CM | POA: Diagnosis not present

## 2014-07-12 DIAGNOSIS — H40003 Preglaucoma, unspecified, bilateral: Secondary | ICD-10-CM | POA: Diagnosis not present

## 2014-07-12 DIAGNOSIS — H25041 Posterior subcapsular polar age-related cataract, right eye: Secondary | ICD-10-CM | POA: Diagnosis not present

## 2014-08-13 DIAGNOSIS — R799 Abnormal finding of blood chemistry, unspecified: Secondary | ICD-10-CM | POA: Diagnosis not present

## 2014-08-28 DIAGNOSIS — B349 Viral infection, unspecified: Secondary | ICD-10-CM | POA: Diagnosis not present

## 2014-09-02 ENCOUNTER — Encounter (HOSPITAL_BASED_OUTPATIENT_CLINIC_OR_DEPARTMENT_OTHER): Payer: Self-pay

## 2014-09-02 ENCOUNTER — Emergency Department (HOSPITAL_BASED_OUTPATIENT_CLINIC_OR_DEPARTMENT_OTHER)
Admission: EM | Admit: 2014-09-02 | Discharge: 2014-09-02 | Disposition: A | Discharge status: Home / Self Care | Payer: Medicare Other | Attending: Emergency Medicine | Admitting: Emergency Medicine

## 2014-09-02 ENCOUNTER — Emergency Department (HOSPITAL_BASED_OUTPATIENT_CLINIC_OR_DEPARTMENT_OTHER): Payer: Medicare Other

## 2014-09-02 DIAGNOSIS — R059 Cough, unspecified: Secondary | ICD-10-CM

## 2014-09-02 DIAGNOSIS — J209 Acute bronchitis, unspecified: Secondary | ICD-10-CM | POA: Insufficient documentation

## 2014-09-02 DIAGNOSIS — K5730 Diverticulosis of large intestine without perforation or abscess without bleeding: Secondary | ICD-10-CM | POA: Diagnosis not present

## 2014-09-02 DIAGNOSIS — Z7982 Long term (current) use of aspirin: Secondary | ICD-10-CM | POA: Insufficient documentation

## 2014-09-02 DIAGNOSIS — J40 Bronchitis, not specified as acute or chronic: Secondary | ICD-10-CM | POA: Diagnosis not present

## 2014-09-02 DIAGNOSIS — N200 Calculus of kidney: Secondary | ICD-10-CM

## 2014-09-02 DIAGNOSIS — R0989 Other specified symptoms and signs involving the circulatory and respiratory systems: Secondary | ICD-10-CM | POA: Diagnosis not present

## 2014-09-02 DIAGNOSIS — N390 Urinary tract infection, site not specified: Secondary | ICD-10-CM | POA: Diagnosis not present

## 2014-09-02 DIAGNOSIS — R05 Cough: Secondary | ICD-10-CM | POA: Diagnosis not present

## 2014-09-02 DIAGNOSIS — Z8639 Personal history of other endocrine, nutritional and metabolic disease: Secondary | ICD-10-CM | POA: Diagnosis not present

## 2014-09-02 DIAGNOSIS — Z79899 Other long term (current) drug therapy: Secondary | ICD-10-CM | POA: Insufficient documentation

## 2014-09-02 HISTORY — DX: Hemochromatosis, unspecified: E83.119

## 2014-09-02 LAB — URINE MICROSCOPIC-ADD ON

## 2014-09-02 LAB — URINALYSIS, ROUTINE W REFLEX MICROSCOPIC
Glucose, UA: NEGATIVE mg/dL
Hgb urine dipstick: NEGATIVE
Ketones, ur: 15 mg/dL — AB
Nitrite: NEGATIVE
Protein, ur: NEGATIVE mg/dL
Specific Gravity, Urine: 1.028 (ref 1.005–1.030)
Urobilinogen, UA: 1 mg/dL (ref 0.0–1.0)
pH: 5.5 (ref 5.0–8.0)

## 2014-09-02 LAB — BASIC METABOLIC PANEL
Anion gap: 2 — ABNORMAL LOW (ref 5–15)
BUN: 12 mg/dL (ref 6–23)
CO2: 29 mmol/L (ref 19–32)
Calcium: 8.5 mg/dL (ref 8.4–10.5)
Chloride: 104 mmol/L (ref 96–112)
Creatinine, Ser: 0.73 mg/dL (ref 0.50–1.10)
GFR calc Af Amer: >90 mL/min (ref >90)
GFR calc non Af Amer: 87 mL/min — ABNORMAL LOW (ref >90)
Glucose, Bld: 103 mg/dL — ABNORMAL HIGH (ref 70–99)
Potassium: 3.5 mmol/L (ref 3.5–5.1)
Sodium: 135 mmol/L (ref 135–145)

## 2014-09-02 MED ORDER — PREDNISONE 20 MG PO TABS: 40.0000 mg | ORAL_TABLET | Freq: Every day | ORAL | Status: DC

## 2014-09-02 MED ORDER — IPRATROPIUM-ALBUTEROL 0.5-2.5 (3) MG/3ML IN SOLN
3.0000 mL | Freq: Once | RESPIRATORY_TRACT | Status: AC
  Administered 2014-09-02: 3 mL via RESPIRATORY_TRACT
  Filled 2014-09-02: qty 3

## 2014-09-02 MED ORDER — CEPHALEXIN 500 MG PO CAPS: 500.0000 mg | ORAL_CAPSULE | Freq: Four times a day (QID) | ORAL | Status: DC

## 2014-09-02 MED ORDER — ALBUTEROL SULFATE HFA 108 (90 BASE) MCG/ACT IN AERS
2.0000 | INHALATION_SPRAY | RESPIRATORY_TRACT | Status: DC | PRN
  Administered 2014-09-02: 2 via RESPIRATORY_TRACT
  Filled 2014-09-02: qty 6.7

## 2014-09-02 NOTE — ED Notes (Signed)
MD at bedside. 

## 2014-09-02 NOTE — ED Notes (Signed)
C/o head,chest congestion, body aches x 1 week-same day she returned from trip to [facility] seen by PCP last week dx with virus-no meds-later cough med was called in by office-also c/o left flank pain with blood in urine x 2-3 days

## 2014-09-02 NOTE — Discharge Instructions (Signed)

## 2014-09-02 NOTE — ED Provider Notes (Signed)
CSN: [id number]     Arrival date & time 09/02/14  1106 History   First MD Initiated Contact with Patient 09/02/14 1129     Chief Complaint  Patient presents with  . URI     (Consider location/radiation/quality/duration/timing/severity/associated sxs/prior Treatment) HPI Comments: Pt states that she is also having some left flank pain and noticed blood in the urine.does have a history of kidney stone  Patient is a 69 y.o. female presenting with URI. The history is provided by the patient. A language interpreter was used.  URI Presenting symptoms: congestion and cough   Presenting symptoms: no fever   Severity:  Mild Onset quality:  Sudden Duration:  1 week Timing:  Constant Progression:  Unchanged Relieved by:  Nothing Worsened by:  Nothing tried Ineffective treatments:  None tried Associated symptoms: myalgias   Associated symptoms: no neck pain and no sinus pain   Risk factors: recent travel     Past Medical History  Diagnosis Date  . Memory loss   . HA (headache)   . Hemochromatosis    Past Surgical History  Procedure Laterality Date  . Vaginal hysterectomy    . Gallbladder surgery     Family History  Problem Relation Age of Onset  . High blood pressure Father   . High Cholesterol Father   . Diabetes Father   . Thyroid disease Mother   . Cancer Mother   . Hemochromatosis Mother    History  Substance Use Topics  . Smoking status: Never Smoker   . Smokeless tobacco: Never Used  . Alcohol Use: No   OB History    No data available     Review of Systems  Constitutional: Negative for fever.  HENT: Positive for congestion.   Respiratory: Positive for cough.   Musculoskeletal: Positive for myalgias. Negative for neck pain.  All other systems reviewed and are negative.     Allergies  Morphine and related  Home Medications   Prior to Admission medications   Medication Sig Start Date End Date Taking? Authorizing Provider  ARMOUR THYROID 60 MG tablet  Take 60 mg by mouth daily. 02/28/13   Historical Provider, MD  aspirin 81 MG tablet Take 81 mg by mouth daily.    Historical Provider, MD  cephALEXin (KEFLEX) 500 MG capsule Take 1 capsule (500 mg total) by mouth 4 (four) times daily. 09/02/14   Glendell Docker, NP  hydrochlorothiazide (HYDRODIURIL) 25 MG tablet Take 25 mg by mouth daily. 02/28/13   Historical Provider, MD  predniSONE (DELTASONE) 20 MG tablet Take 2 tablets (40 mg total) by mouth daily with breakfast. 09/02/14   Glendell Docker, NP   BP 106/88 mmHg  Pulse 98  Temp(Src) 98.5 F (36.9 C) (Oral)  Resp 16  Ht 5\' 3"  (1.6 m)  Wt 175 lb (79.379 kg)  BMI 31.01 kg/m2  SpO2 99% Physical Exam  Constitutional: She is oriented to person, place, and time. She appears well-developed and well-nourished.  HENT:  Head: Normocephalic and atraumatic.  Right Ear: External ear normal.  Left Ear: External ear normal.  Nose: Rhinorrhea present.  Mouth/Throat: Posterior oropharyngeal erythema present.  Eyes: Conjunctivae and EOM are normal. Pupils are equal, round, and reactive to light.  Neck: Normal range of motion. Neck supple.  Cardiovascular: Normal rate and regular rhythm.   Pulmonary/Chest: No respiratory distress. She has wheezes.  Abdominal: There is no rebound.  Musculoskeletal: Normal range of motion.  Neurological: She is alert and oriented to person, place, and time.  Skin: Skin is dry.  Nursing note and vitals reviewed.   ED Course  Procedures (including critical care time) Labs Review Labs Reviewed  URINALYSIS, ROUTINE W REFLEX MICROSCOPIC - Abnormal; Notable for the following:    Color, Urine AMBER (*)    Bilirubin Urine SMALL (*)    Ketones, ur 15 (*)    Leukocytes, UA SMALL (*)    All other components within normal limits  BASIC METABOLIC PANEL - Abnormal; Notable for the following:    Glucose, Bld 103 (*)    GFR calc non Af Amer 87 (*)    Anion gap 2 (*)    All other components within normal limits  URINE  CULTURE  URINE MICROSCOPIC-ADD ON    Imaging Review Dg Chest 2 View  09/02/2014   CLINICAL DATA:  Chest congestion, recent travel to [place]  EXAM: CHEST  2 VIEW  COMPARISON:  02/13/2004  FINDINGS: The heart size and mediastinal contours are within normal limits. Both lungs are clear. The visualized skeletal structures are unremarkable.  IMPRESSION: No active cardiopulmonary disease.   Electronically Signed   By: Inez Catalina M.D.   On: 09/02/2014 11:47   Ct Renal Stone Study  09/02/2014   CLINICAL DATA:  Left flank pain for 3 days with hematuria, initial encounter  EXAM: CT ABDOMEN AND PELVIS WITHOUT CONTRAST  TECHNIQUE: Multidetector CT imaging of the abdomen and pelvis was performed following the standard protocol without IV contrast.  COMPARISON:  None.  FINDINGS: The lung bases are free of acute infiltrate or sizable effusion.  The gallbladder is been surgically removed. The liver, spleen, adrenal glands and pancreas are within normal limits. The kidneys are well visualized bilaterally without obstructive changes. A tiny right renal stone is noted on image number 32 of series 2.  Diverticulosis without evidence of diverticulitis. The appendix is within normal limits. The uterus has been surgically removed.  IMPRESSION: Diverticulosis without diverticulitis.  Tiny nonobstructing right renal stone.  Postsurgical changes.   Electronically Signed   By: Inez Catalina M.D.   On: 09/02/2014 11:55     EKG Interpretation None      MDM   Final diagnoses:  Cough  UTI (lower urinary tract infection)  Bronchitis  Kidney stone    Will treat for uti and bronchitis. Discussed return precautions.non toxic in appearance.   Glendell Docker, NP 09/02/14 [place], MD 09/02/14 1317

## 2014-09-05 ENCOUNTER — Telehealth (HOSPITAL_BASED_OUTPATIENT_CLINIC_OR_DEPARTMENT_OTHER): Payer: Self-pay | Admitting: Emergency Medicine

## 2014-09-05 LAB — URINE CULTURE: Colony Count: 70000

## 2014-09-09 ENCOUNTER — Telehealth: Payer: Self-pay | Admitting: *Deleted

## 2014-12-02 ENCOUNTER — Telehealth: Payer: Self-pay | Admitting: Hematology and Oncology

## 2014-12-02 NOTE — Telephone Encounter (Signed)
new patient referral-called patient to schedule np appt no able to leave vm.

## 2014-12-09 DIAGNOSIS — E28310 Symptomatic premature menopause: Secondary | ICD-10-CM | POA: Diagnosis not present

## 2014-12-09 DIAGNOSIS — R5382 Chronic fatigue, unspecified: Secondary | ICD-10-CM | POA: Diagnosis not present

## 2014-12-09 DIAGNOSIS — E569 Vitamin deficiency, unspecified: Secondary | ICD-10-CM | POA: Diagnosis not present

## 2014-12-09 DIAGNOSIS — E039 Hypothyroidism, unspecified: Secondary | ICD-10-CM | POA: Diagnosis not present

## 2014-12-09 DIAGNOSIS — R799 Abnormal finding of blood chemistry, unspecified: Secondary | ICD-10-CM | POA: Diagnosis not present

## 2014-12-09 DIAGNOSIS — D510 Vitamin B12 deficiency anemia due to intrinsic factor deficiency: Secondary | ICD-10-CM | POA: Diagnosis not present

## 2014-12-09 DIAGNOSIS — E559 Vitamin D deficiency, unspecified: Secondary | ICD-10-CM | POA: Diagnosis not present

## 2014-12-09 DIAGNOSIS — L659 Nonscarring hair loss, unspecified: Secondary | ICD-10-CM | POA: Diagnosis not present

## 2015-01-21 DIAGNOSIS — R5383 Other fatigue: Secondary | ICD-10-CM | POA: Diagnosis not present

## 2015-01-21 DIAGNOSIS — E039 Hypothyroidism, unspecified: Secondary | ICD-10-CM | POA: Diagnosis not present

## 2015-01-21 DIAGNOSIS — R0789 Other chest pain: Secondary | ICD-10-CM | POA: Diagnosis not present

## 2015-02-03 ENCOUNTER — Other Ambulatory Visit: Payer: Self-pay

## 2015-02-28 DIAGNOSIS — H2512 Age-related nuclear cataract, left eye: Secondary | ICD-10-CM | POA: Diagnosis not present

## 2015-02-28 DIAGNOSIS — H25012 Cortical age-related cataract, left eye: Secondary | ICD-10-CM | POA: Diagnosis not present

## 2015-04-01 ENCOUNTER — Ambulatory Visit (INDEPENDENT_AMBULATORY_CARE_PROVIDER_SITE_OTHER): Payer: Medicare Other | Admitting: Cardiology

## 2015-04-01 ENCOUNTER — Encounter: Payer: Self-pay | Admitting: Cardiology

## 2015-04-01 VITALS — BP 130/76 | HR 81 | Ht 62.5 in | Wt 184.0 lb

## 2015-04-01 DIAGNOSIS — R002 Palpitations: Secondary | ICD-10-CM

## 2015-04-01 DIAGNOSIS — R079 Chest pain, unspecified: Secondary | ICD-10-CM | POA: Diagnosis not present

## 2015-04-01 NOTE — Patient Instructions (Signed)
Medication Instructions:  Your physician recommends that you continue on your current medications as directed. Please refer to the Current Medication list given to you today.  Testing/Procedures: Your physician has requested that you have an echocardiogram. Echocardiography is a painless test that uses sound waves to create images of your heart. It provides your doctor with information about the size and shape of your heart and how well your heart's chambers and valves are working. This procedure takes approximately one hour. There are no restrictions for this procedure.  Your physician has recommended that you wear an event monitor. Event monitors are medical devices that record the heart's electrical activity. Doctors most often [place] these monitors to diagnose arrhythmias. Arrhythmias are problems with the speed or rhythm of the heartbeat. The monitor is a small, portable device. You can wear one while you do your normal daily activities. This is usually used to diagnose what is causing palpitations/syncope (passing out).  Follow-Up: Follow up in 6 weeks with Dr Marlou Porch. (after results on monitor and echo)  Thank you for choosing [facility]!!

## 2015-04-01 NOTE — Progress Notes (Signed)
Cardiology Office Note   Date:  04/01/2015   ID:  Kamree, Wiens Oct 14, 1945, MRN [id number]  PCP:  Shirline Frees, MD  Cardiologist:   Candee Furbish, MD       History of Present Illness: Rhonda Reese is a 69 y.o. female who presents for evaluation of chest tightness. Has a history of hemachromatosis. Previously had a stress test in 2011 which was reassuring. Has been staying fairly active but noticed that she is getting more tired especially going up and down the stairs in her house. Can feel palpitations as well. Chest tightness can come and go can radiate to her neck at times. Had mild pain and pressure in chest, not necessarily when more active. At [facility] had a tough time. She feels a jolt in mid chest and cough and feels a boom. Can have days when she would be wiped out tired.  Stress. Happens oftens.  Travels on mission trips. No syncope.   Ferritin WNL. Prior had elevated levels.   Has good days and bad. Also has occasional left upper and right upper abdominal discomfort.  Past Medical History  Diagnosis Date  . Memory loss   . HA (headache)   . Hemochromatosis     Past Surgical History  Procedure Laterality Date  . Vaginal hysterectomy    . Gallbladder surgery       Current Outpatient Prescriptions  Medication Sig Dispense Refill  . ARMOUR THYROID 60 MG tablet TK 1 T PO QAM  5  . aspirin 81 MG tablet Take 81 mg by mouth daily.    . hydrochlorothiazide (HYDRODIURIL) 25 MG tablet Take 25 mg by mouth daily.    Marland Kitchen omeprazole (PRILOSEC) 40 MG capsule TK ONE C PO  D  2  . cephALEXin (KEFLEX) 500 MG capsule Take 1 capsule (500 mg total) by mouth 4 (four) times daily. (Patient not taking: Reported on 04/01/2015) 28 capsule 0   No current facility-administered medications for this visit.    Allergies:   Morphine and related and Other    Social History:  The patient  reports that she has never smoked. She has never used smokeless tobacco. She reports  that she does not drink alcohol or use illicit drugs.   Family History:  The patient's family history includes Cancer in her mother; Diabetes in her father; Hemochromatosis in her mother; High Cholesterol in her father; High blood pressure in her father; Thyroid disease in her mother. Father had CHF. Lung cancer.    ROS:  Please see the history of present illness.   Otherwise, review of systems are positive for occasional leg swelling, insomnia times, shortness of breath, palpitations accompanying with cough. Chest pressure, skipped heartbeats, occasional abdominal discomfort, low back pain.   All other systems are reviewed and negative.    PHYSICAL EXAM: VS:  BP 130/76 mmHg  Pulse 81  Ht 5' 2.5" (1.588 m)  Wt 184 lb (83.462 kg)  BMI 33.10 kg/m2 , BMI Body mass index is 33.1 kg/(m^2). GEN: Well nourished, well developed, in no acute distress HEENT: normal Neck: no JVD, carotid bruits, or masses Cardiac: RRR; no murmurs, rubs, split S2 appreciated,no edema  Respiratory:  clear to auscultation bilaterally, normal work of breathing GI: soft, nontender, nondistended, + BS MS: no deformity or atrophy Skin: warm and dry, no rash, hyperpigmented Neuro:  Strength and sensation are intact Psych: euthymic mood, full affect   EKG:  EKG is ordered today. The ekg ordered today demonstrates  today 04/01/15-sinus rhythm, no other abnormalities   Recent Labs: 05/02/2014: ALT 30; Hemoglobin 15.8; Platelets 334; TSH 1.150 09/02/2014: BUN 12; Creatinine, Ser 0.73; Potassium 3.5; Sodium 135    Lipid Panel No results found for: CHOL, TRIG, HDL, CHOLHDL, VLDL, LDLCALC, LDLDIRECT    Wt Readings from Last 3 Encounters:  04/01/15 184 lb (83.462 kg)  09/02/14 175 lb (79.379 kg)  05/02/14 176 lb (79.833 kg)      Other studies Reviewed: Additional studies/ records that were reviewed today include: prior office notes, [place] stress 2011. Review of the above records demonstrates: as above   ASSESSMENT  AND PLAN:  1.  Palpitations-by her description, sounds physiologically like PVCs. We will go ahead and record a 30 day event monitor to ensure that she does not have any episodes of atrial fibrillation which if discovered, could cause intermittent fatigue-like symptoms.  2. Atypical chest pain-previous nuclear stress test in 2011 was reassuring. I will start off with an echocardiogram given her prior history of hemachromatosis. I want to make sure that there is no evidence of cardiomyopathy.  3. Obesity - encourage weight loss.  4. Hemochromatosis-could have cardiovascular sequela. Cardiomyopathy could develop. Also if iron infiltration has taken place, could result in foci for PVCs for instance.     Current medicines are reviewed at length with the patient today.  The patient does not have concerns regarding medicines.  The following changes have been made:  no change  Labs/ tests ordered today include: ECHO and Event monitor No orders of the defined types were placed in this encounter.     Disposition:   FU with [facility] in 6 weeks  Signed, Candee Furbish, MD  04/01/2015 2:50 PM    [facility] [address], [place], [place]  [address] Phone: [phone]; Fax: [phone]

## 2015-04-08 ENCOUNTER — Ambulatory Visit (INDEPENDENT_AMBULATORY_CARE_PROVIDER_SITE_OTHER): Payer: Medicare Other

## 2015-04-08 ENCOUNTER — Other Ambulatory Visit: Payer: Self-pay

## 2015-04-08 ENCOUNTER — Ambulatory Visit (HOSPITAL_COMMUNITY): Payer: Medicare Other | Attending: Cardiology

## 2015-04-08 DIAGNOSIS — R079 Chest pain, unspecified: Secondary | ICD-10-CM

## 2015-04-08 DIAGNOSIS — R002 Palpitations: Secondary | ICD-10-CM | POA: Diagnosis not present

## 2015-04-08 DIAGNOSIS — I071 Rheumatic tricuspid insufficiency: Secondary | ICD-10-CM | POA: Insufficient documentation

## 2015-04-10 DIAGNOSIS — H2512 Age-related nuclear cataract, left eye: Secondary | ICD-10-CM | POA: Diagnosis not present

## 2015-04-10 DIAGNOSIS — H2513 Age-related nuclear cataract, bilateral: Secondary | ICD-10-CM | POA: Diagnosis not present

## 2015-04-10 DIAGNOSIS — Z1231 Encounter for screening mammogram for malignant neoplasm of breast: Secondary | ICD-10-CM | POA: Diagnosis not present

## 2015-04-10 DIAGNOSIS — M8580 Other specified disorders of bone density and structure, unspecified site: Secondary | ICD-10-CM | POA: Diagnosis not present

## 2015-04-11 DIAGNOSIS — H2511 Age-related nuclear cataract, right eye: Secondary | ICD-10-CM | POA: Diagnosis not present

## 2015-04-25 ENCOUNTER — Encounter (HOSPITAL_BASED_OUTPATIENT_CLINIC_OR_DEPARTMENT_OTHER): Payer: Self-pay | Admitting: *Deleted

## 2015-04-25 ENCOUNTER — Emergency Department (HOSPITAL_BASED_OUTPATIENT_CLINIC_OR_DEPARTMENT_OTHER)
Admission: EM | Admit: 2015-04-25 | Discharge: 2015-04-25 | Disposition: A | Discharge status: Home / Self Care | Payer: Medicare Other | Attending: Emergency Medicine | Admitting: Emergency Medicine

## 2015-04-25 ENCOUNTER — Emergency Department (HOSPITAL_BASED_OUTPATIENT_CLINIC_OR_DEPARTMENT_OTHER): Payer: Medicare Other

## 2015-04-25 DIAGNOSIS — K5730 Diverticulosis of large intestine without perforation or abscess without bleeding: Secondary | ICD-10-CM | POA: Diagnosis not present

## 2015-04-25 DIAGNOSIS — R109 Unspecified abdominal pain: Secondary | ICD-10-CM | POA: Insufficient documentation

## 2015-04-25 DIAGNOSIS — Z79899 Other long term (current) drug therapy: Secondary | ICD-10-CM | POA: Insufficient documentation

## 2015-04-25 DIAGNOSIS — Z7982 Long term (current) use of aspirin: Secondary | ICD-10-CM | POA: Insufficient documentation

## 2015-04-25 DIAGNOSIS — R1032 Left lower quadrant pain: Secondary | ICD-10-CM | POA: Diagnosis not present

## 2015-04-25 DIAGNOSIS — R1012 Left upper quadrant pain: Secondary | ICD-10-CM | POA: Diagnosis not present

## 2015-04-25 DIAGNOSIS — Z8639 Personal history of other endocrine, nutritional and metabolic disease: Secondary | ICD-10-CM | POA: Diagnosis not present

## 2015-04-25 DIAGNOSIS — R1013 Epigastric pain: Secondary | ICD-10-CM | POA: Diagnosis present

## 2015-04-25 LAB — CBC WITH DIFFERENTIAL/PLATELET
Basophils Absolute: 0 10*3/uL (ref 0.0–0.1)
Basophils Relative: 0 %
Eosinophils Absolute: 0.1 10*3/uL (ref 0.0–0.7)
Eosinophils Relative: 1 %
HCT: 45.9 % (ref 36.0–46.0)
Hemoglobin: 15.4 g/dL — ABNORMAL HIGH (ref 12.0–15.0)
Lymphocytes Relative: 45 %
Lymphs Abs: 3.5 10*3/uL (ref 0.7–4.0)
MCH: 30.5 pg (ref 26.0–34.0)
MCHC: 33.6 g/dL (ref 30.0–36.0)
MCV: 90.9 fL (ref 78.0–100.0)
Monocytes Absolute: 0.4 10*3/uL (ref 0.1–1.0)
Monocytes Relative: 5 %
Neutro Abs: 3.7 10*3/uL (ref 1.7–7.7)
Neutrophils Relative %: 49 %
Platelets: 303 10*3/uL (ref 150–400)
RBC: 5.05 MIL/uL (ref 3.87–5.11)
RDW: 12.6 % (ref 11.5–15.5)
WBC: 7.7 10*3/uL (ref 4.0–10.5)

## 2015-04-25 LAB — COMPREHENSIVE METABOLIC PANEL
ALT: 26 U/L (ref 14–54)
AST: 22 U/L (ref 15–41)
Albumin: 4.4 g/dL (ref 3.5–5.0)
Alkaline Phosphatase: 74 U/L (ref 38–126)
Anion gap: 8 (ref 5–15)
BUN: 12 mg/dL (ref 6–20)
CO2: 31 mmol/L (ref 22–32)
Calcium: 9.8 mg/dL (ref 8.9–10.3)
Chloride: 103 mmol/L (ref 101–111)
Creatinine, Ser: 0.7 mg/dL (ref 0.44–1.00)
GFR calc Af Amer: >60 mL/min (ref >60)
GFR calc non Af Amer: >60 mL/min (ref >60)
Glucose, Bld: 94 mg/dL (ref 65–99)
Potassium: 4 mmol/L (ref 3.5–5.1)
Sodium: 142 mmol/L (ref 135–145)
Total Bilirubin: 0.7 mg/dL (ref 0.3–1.2)
Total Protein: 7.6 g/dL (ref 6.5–8.1)

## 2015-04-25 LAB — URINALYSIS, ROUTINE W REFLEX MICROSCOPIC
Bilirubin Urine: NEGATIVE
Glucose, UA: NEGATIVE mg/dL
Hgb urine dipstick: NEGATIVE
Ketones, ur: NEGATIVE mg/dL
Nitrite: NEGATIVE
Protein, ur: NEGATIVE mg/dL
Specific Gravity, Urine: 1.009 (ref 1.005–1.030)
Urobilinogen, UA: 0.2 mg/dL (ref 0.0–1.0)
pH: 7 (ref 5.0–8.0)

## 2015-04-25 LAB — URINE MICROSCOPIC-ADD ON

## 2015-04-25 MED ORDER — IOHEXOL 300 MG/ML  SOLN
100.0000 mL | Freq: Once | INTRAMUSCULAR | Status: AC | PRN
Start: 2015-04-25 — End: 2015-04-25
  Administered 2015-04-25: 100 mL via INTRAVENOUS

## 2015-04-25 MED ORDER — IOHEXOL 300 MG/ML  SOLN
25.0000 mL | Freq: Once | INTRAMUSCULAR | Status: AC | PRN
  Administered 2015-04-25: 25 mL via ORAL

## 2015-04-25 NOTE — ED Notes (Signed)
Patient transported to CT 

## 2015-04-25 NOTE — ED Notes (Signed)
Pt amb to triage with quick steady gait in nad. Pt reports left lower quad pain off and on for weeks, has seen her pcp, rx for reflux given, pt states he pain is no better. Pt also reports pale gray stools.

## 2015-04-25 NOTE — Discharge Instructions (Signed)
Abdominal Pain, Women °Abdominal (stomach, pelvic, or belly) pain can be caused by many things. It is important to tell your doctor: °· The location of the pain. °· Does it come and go or is it present all the time? °· Are there things that start the pain (eating certain foods, exercise)? °· Are there other symptoms associated with the pain (fever, nausea, vomiting, diarrhea)? °All of this is helpful to know when trying to find the cause of the pain. °CAUSES  °· Stomach: virus or bacteria infection, or ulcer. °· Intestine: appendicitis (inflamed appendix), regional ileitis (Crohn's disease), ulcerative colitis (inflamed colon), irritable bowel syndrome, diverticulitis (inflamed diverticulum of the colon), or cancer of the stomach or intestine. °· Gallbladder disease or stones in the gallbladder. °· Kidney disease, kidney stones, or infection. °· Pancreas infection or cancer. °· Fibromyalgia (pain disorder). °· Diseases of the female organs: °¨ Uterus: fibroid (non-cancerous) tumors or infection. °¨ Fallopian tubes: infection or tubal pregnancy. °¨ Ovary: cysts or tumors. °¨ Pelvic adhesions (scar tissue). °¨ Endometriosis (uterus lining tissue growing in the pelvis and on the pelvic organs). °¨ Pelvic congestion syndrome (female organs filling up with blood just before the menstrual period). °¨ Pain with the menstrual period. °¨ Pain with ovulation (producing an egg). °¨ Pain with an IUD (intrauterine device, birth control) in the uterus. °¨ Cancer of the female organs. °· Functional pain (pain not caused by a disease, may improve without treatment). °· Psychological pain. °· Depression. °DIAGNOSIS  °Your doctor will decide the seriousness of your pain by doing an examination. °· Blood tests. °· X-rays. °· Ultrasound. °· CT scan (computed tomography, special type of X-ray). °· MRI (magnetic resonance imaging). °· Cultures, for infection. °· Barium enema (dye inserted in the large intestine, to better view it with  X-rays). °· Colonoscopy (looking in intestine with a lighted tube). °· Laparoscopy (minor surgery, looking in abdomen with a lighted tube). °· Major abdominal exploratory surgery (looking in abdomen with a large incision). °TREATMENT  °The treatment will depend on the cause of the pain.  °· Many cases can be observed and treated at home. °· Over-the-counter medicines recommended by your caregiver. °· Prescription medicine. °· Antibiotics, for infection. °· Birth control pills, for painful periods or for ovulation pain. °· Hormone treatment, for endometriosis. °· Nerve blocking injections. °· Physical therapy. °· Antidepressants. °· Counseling with a psychologist or psychiatrist. °· Minor or major surgery. °HOME CARE INSTRUCTIONS  °· Do not take laxatives, unless directed by your caregiver. °· Take over-the-counter pain medicine only if ordered by your caregiver. Do not take aspirin because it can cause an upset stomach or bleeding. °· Try a clear liquid diet (broth or water) as ordered by your caregiver. Slowly move to a bland diet, as tolerated, if the pain is related to the stomach or intestine. °· Have a thermometer and take your temperature several times a day, and record it. °· Bed rest and sleep, if it helps the pain. °· Avoid sexual intercourse, if it causes pain. °· Avoid stressful situations. °· Keep your follow-up appointments and tests, as your caregiver orders. °· If the pain does not go away with medicine or surgery, you may try: °¨ Acupuncture. °¨ Relaxation exercises (yoga, meditation). °¨ Group therapy. °¨ Counseling. °SEEK MEDICAL CARE IF:  °· You notice certain foods cause stomach pain. °· Your home care treatment is not helping your pain. °· You need stronger pain medicine. °· You want your IUD removed. °· You feel faint or   lightheaded. °· You develop nausea and vomiting. °· You develop a rash. °· You are having side effects or an allergy to your medicine. °SEEK IMMEDIATE MEDICAL CARE IF:  °· Your  pain does not go away or gets worse. °· You have a fever. °· Your pain is felt only in portions of the abdomen. The right side could possibly be appendicitis. The left lower portion of the abdomen could be colitis or diverticulitis. °· You are passing blood in your stools (bright red or black tarry stools, with or without vomiting). °· You have blood in your urine. °· You develop chills, with or without a fever. °· You pass out. °MAKE SURE YOU:  °· Understand these instructions. °· Will watch your condition. °· Will get help right away if you are not doing well or get worse. °Document Released: 05/23/2007 Document Revised: 12/10/2013 Document Reviewed: 06/12/2009 °Exit[employer]® Patient Information ©2015 [employer]. This information is not intended to replace advice given to you by your health care provider. Make sure you discuss any questions you have with your health care provider. ° °

## 2015-04-25 NOTE — ED Provider Notes (Deleted)
CSN: [id number]     Arrival date & time 04/25/15  1023 History   First MD Initiated Contact with Patient 04/25/15 1310     Chief Complaint  Patient presents with  . Abdominal Pain     (Consider location/radiation/quality/duration/timing/severity/associated sxs/prior Treatment) HPI  Past Medical History  Diagnosis Date  . Memory loss   . HA (headache)   . Hemochromatosis    Past Surgical History  Procedure Laterality Date  . Vaginal hysterectomy    . Gallbladder surgery     Family History  Problem Relation Age of Onset  . High blood pressure Father   . High Cholesterol Father   . Diabetes Father   . Thyroid disease Mother   . Cancer Mother   . Hemochromatosis Mother    Social History  Substance Use Topics  . Smoking status: Never Smoker   . Smokeless tobacco: Never Used  . Alcohol Use: No   OB History    No data available     Review of Systems    Allergies  Morphine and related and Other  Home Medications   Prior to Admission medications   Medication Sig Start Date End Date Taking? Authorizing Provider  ARMOUR THYROID 60 MG tablet TK 1 T PO QAM 03/12/15   Historical Provider, MD  aspirin 81 MG tablet Take 81 mg by mouth daily.    Historical Provider, MD  hydrochlorothiazide (HYDRODIURIL) 25 MG tablet Take 25 mg by mouth daily. 02/28/13   Historical Provider, MD  omeprazole (PRILOSEC) 40 MG capsule TK ONE C PO  D 03/12/15   Historical Provider, MD   BP 146/61 mmHg  Pulse 63  Temp(Src) 98.4 F (36.9 C) (Oral)  Resp 18  Ht 5\' 2"  (1.575 m)  Wt 178 lb (80.74 kg)  BMI 32.55 kg/m2  SpO2 100% Physical Exam  ED Course  Procedures (including critical care time) Labs Review Labs Reviewed  CBC WITH DIFFERENTIAL/PLATELET - Abnormal; Notable for the following:    Hemoglobin 15.4 (*)    All other components within normal limits  URINALYSIS, ROUTINE W REFLEX MICROSCOPIC (NOT AT [facility]) - Abnormal; Notable for the following:    Leukocytes, UA TRACE (*)    All  other components within normal limits  COMPREHENSIVE METABOLIC PANEL  URINE MICROSCOPIC-ADD ON    Imaging Review Ct Abdomen Pelvis W Contrast  04/25/2015   CLINICAL DATA:  Left lower quadrant pain for 3 days, nausea  EXAM: CT ABDOMEN AND PELVIS WITH CONTRAST  TECHNIQUE: Multidetector CT imaging of the abdomen and pelvis was performed using the standard protocol following bolus administration of intravenous contrast.  CONTRAST:  48mL OMNIPAQUE IOHEXOL 300 MG/ML SOLN, 115mL OMNIPAQUE IOHEXOL 300 MG/ML SOLN  COMPARISON:  09/02/2014  FINDINGS: Lung bases are unremarkable. Small hiatal hernia. Sagittal images of the spine shows degenerative changes lumbar spine. Multilevel disc space flattening with vacuum disc phenomenon. Mild fatty infiltration of the liver. Status postcholecystectomy. CBD measures 1.1 cm in diameter. The pancreas, spleen and adrenal glands are unremarkable. Atherosclerotic calcifications of abdominal aorta and iliac arteries. No aortic aneurysm.  Kidneys are symmetrical in size and enhancement. No hydronephrosis or hydroureter. Delayed renal images shows bilateral renal symmetrical excretion. Bilateral visualized proximal ureter is unremarkable.  There is no small bowel obstruction. No ascites or free air. No adenopathy. There is a low lying cecum. Normal appendix is noted in axial image 51 in mid posterior pelvis presacral region.  Few diverticula are noted descending colon. No evidence of acute diverticulitis. Few  diverticula are noted sigmoid colon. No evidence of acute diverticulitis. The patient is status post hysterectomy. Mild degenerative changes bilateral SI joints. No inguinal adenopathy.  IMPRESSION: 1. There is no acute inflammatory process within abdomen or pelvis. 2. Fatty infiltration of the liver. 3. No hydronephrosis or hydroureter. 4. There is a low lying cecum. Normal appendix. No pericecal inflammation. 5. Few distal colonic diverticula. No evidence of acute diverticulitis.  6. Status post hysterectomy.   Electronically Signed   By: Lahoma Crocker M.D.   On: 04/25/2015 17:14   I have personally reviewed and evaluated these images and lab results as part of my medical decision-making.   EKG Interpretation None     Patient discussed with and seen by Dr. Dayna Barker. Lab and radiology results reviewed and shared with patient. No acute or worrisome findings. Patient to follow-up with her PCP. Return precautions discussed. Patient discussed with Dr. Dayna Barker. MDM   Final diagnoses:  Abdominal pain in female patient        Rhonda Quill, NP 04/25/15 2302

## 2015-04-25 NOTE — ED Provider Notes (Signed)
CSN: [id number]     Arrival date & time 04/25/15  1023 History   First MD Initiated Contact with Patient 04/25/15 1310     Chief Complaint  Patient presents with  . Abdominal Pain     (Consider location/radiation/quality/duration/timing/severity/associated sxs/prior Treatment) Patient is a 69 y.o. female presenting with abdominal pain. The history is provided by the patient.  Abdominal Pain Pain location:  Epigastric, LUQ, LLQ and L flank Pain quality: aching and squeezing   Duration:  3 weeks Timing:  Intermittent Progression:  Waxing and waning Chronicity:  Recurrent Context: not recent illness, not recent travel, not sick contacts and not suspicious food intake   Associated symptoms: no chills, no fever and no hematuria   Associated symptoms comment:  Pearline Cables colored stools   Past Medical History  Diagnosis Date  . Memory loss   . HA (headache)   . Hemochromatosis    Past Surgical History  Procedure Laterality Date  . Vaginal hysterectomy    . Gallbladder surgery     Family History  Problem Relation Age of Onset  . High blood pressure Father   . High Cholesterol Father   . Diabetes Father   . Thyroid disease Mother   . Cancer Mother   . Hemochromatosis Mother    Social History  Substance Use Topics  . Smoking status: Never Smoker   . Smokeless tobacco: Never Used  . Alcohol Use: No   OB History    No data available     Review of Systems  Constitutional: Negative for fever and chills.  Gastrointestinal: Positive for abdominal pain.  Genitourinary: Negative for hematuria and difficulty urinating.  All other systems reviewed and are negative.     Allergies  Morphine and related and Other  Home Medications   Prior to Admission medications   Medication Sig Start Date End Date Taking? Authorizing Provider  ARMOUR THYROID 60 MG tablet TK 1 T PO QAM 03/12/15   Historical Provider, MD  aspirin 81 MG tablet Take 81 mg by mouth daily.    Historical Provider,  MD  hydrochlorothiazide (HYDRODIURIL) 25 MG tablet Take 25 mg by mouth daily. 02/28/13   Historical Provider, MD  omeprazole (PRILOSEC) 40 MG capsule TK ONE C PO  D 03/12/15   Historical Provider, MD   BP 141/72 mmHg  Pulse 82  Temp(Src) 98.4 F (36.9 C) (Oral)  Resp 18  Ht 5\' 2"  (1.575 m)  Wt 178 lb (80.74 kg)  BMI 32.55 kg/m2  SpO2 97% Physical Exam  Constitutional: She is oriented to person, place, and time. She appears well-developed and well-nourished.  HENT:  Head: Normocephalic.  Eyes: Pupils are equal, round, and reactive to light.  Neck: Neck supple.  Cardiovascular: Normal rate and regular rhythm.   Pulmonary/Chest: Effort normal and breath sounds normal.  Abdominal: Soft. There is tenderness.  Musculoskeletal: She exhibits no edema or tenderness.  Neurological: She is alert and oriented to person, place, and time.  Skin: Skin is warm and dry.  Psychiatric: She has a normal mood and affect.  Nursing note and vitals reviewed.   ED Course  Procedures (including critical care time) Labs Review Labs Reviewed  CBC WITH DIFFERENTIAL/PLATELET - Abnormal; Notable for the following:    Hemoglobin 15.4 (*)    All other components within normal limits  URINALYSIS, ROUTINE W REFLEX MICROSCOPIC (NOT AT [facility]) - Abnormal; Notable for the following:    Leukocytes, UA TRACE (*)    All other components within normal limits  COMPREHENSIVE METABOLIC PANEL  URINE MICROSCOPIC-ADD ON    Imaging Review Ct Abdomen Pelvis W Contrast  04/25/2015   CLINICAL DATA:  Left lower quadrant pain for 3 days, nausea  EXAM: CT ABDOMEN AND PELVIS WITH CONTRAST  TECHNIQUE: Multidetector CT imaging of the abdomen and pelvis was performed using the standard protocol following bolus administration of intravenous contrast.  CONTRAST:  50mL OMNIPAQUE IOHEXOL 300 MG/ML SOLN, 172mL OMNIPAQUE IOHEXOL 300 MG/ML SOLN  COMPARISON:  09/02/2014  FINDINGS: Lung bases are unremarkable. Small hiatal hernia. Sagittal  images of the spine shows degenerative changes lumbar spine. Multilevel disc space flattening with vacuum disc phenomenon. Mild fatty infiltration of the liver. Status postcholecystectomy. CBD measures 1.1 cm in diameter. The pancreas, spleen and adrenal glands are unremarkable. Atherosclerotic calcifications of abdominal aorta and iliac arteries. No aortic aneurysm.  Kidneys are symmetrical in size and enhancement. No hydronephrosis or hydroureter. Delayed renal images shows bilateral renal symmetrical excretion. Bilateral visualized proximal ureter is unremarkable.  There is no small bowel obstruction. No ascites or free air. No adenopathy. There is a low lying cecum. Normal appendix is noted in axial image 51 in mid posterior pelvis presacral region.  Few diverticula are noted descending colon. No evidence of acute diverticulitis. Few diverticula are noted sigmoid colon. No evidence of acute diverticulitis. The patient is status post hysterectomy. Mild degenerative changes bilateral SI joints. No inguinal adenopathy.  IMPRESSION: 1. There is no acute inflammatory process within abdomen or pelvis. 2. Fatty infiltration of the liver. 3. No hydronephrosis or hydroureter. 4. There is a low lying cecum. Normal appendix. No pericecal inflammation. 5. Few distal colonic diverticula. No evidence of acute diverticulitis. 6. Status post hysterectomy.   Electronically Signed   By: Lahoma Crocker M.D.   On: 04/25/2015 17:14   I have personally reviewed and evaluated these images and lab results as part of my medical decision-making.   EKG Interpretation None     Patient discussed with and seen by Dr. Dayna Barker. Lab and radiology results reviewed and shared with patient. No acute or worrisome findings. Patient to follow-up with her PCP. Return precautions discussed. Patient discussed with Dr. Dayna Barker. MDM   Final diagnoses:  None    Abdominal pain.    Etta Quill, NP 04/25/15 2313  Merrily Pew, MD 04/26/15  [phone]

## 2015-04-29 ENCOUNTER — Telehealth: Payer: Self-pay | Admitting: Internal Medicine

## 2015-04-29 ENCOUNTER — Encounter: Payer: Self-pay | Admitting: Gastroenterology

## 2015-04-29 DIAGNOSIS — R109 Unspecified abdominal pain: Secondary | ICD-10-CM | POA: Diagnosis not present

## 2015-04-29 NOTE — Telephone Encounter (Signed)
OK to transfer 

## 2015-04-30 DIAGNOSIS — Z01419 Encounter for gynecological examination (general) (routine) without abnormal findings: Secondary | ICD-10-CM | POA: Diagnosis not present

## 2015-04-30 DIAGNOSIS — R102 Pelvic and perineal pain: Secondary | ICD-10-CM | POA: Diagnosis not present

## 2015-04-30 DIAGNOSIS — Z6833 Body mass index (BMI) 33.0-33.9, adult: Secondary | ICD-10-CM | POA: Diagnosis not present

## 2015-04-30 DIAGNOSIS — Z8041 Family history of malignant neoplasm of ovary: Secondary | ICD-10-CM | POA: Diagnosis not present

## 2015-05-01 DIAGNOSIS — H25811 Combined forms of age-related cataract, right eye: Secondary | ICD-10-CM | POA: Diagnosis not present

## 2015-05-01 DIAGNOSIS — H2511 Age-related nuclear cataract, right eye: Secondary | ICD-10-CM | POA: Diagnosis not present

## 2015-05-09 ENCOUNTER — Other Ambulatory Visit: Payer: Self-pay

## 2015-05-13 ENCOUNTER — Encounter: Payer: Self-pay | Admitting: Cardiology

## 2015-05-13 ENCOUNTER — Ambulatory Visit (INDEPENDENT_AMBULATORY_CARE_PROVIDER_SITE_OTHER): Payer: Medicare Other | Admitting: Cardiology

## 2015-05-13 VITALS — BP 140/66 | HR 75 | Ht 62.0 in | Wt 187.8 lb

## 2015-05-13 DIAGNOSIS — E669 Obesity, unspecified: Secondary | ICD-10-CM | POA: Diagnosis not present

## 2015-05-13 DIAGNOSIS — R0789 Other chest pain: Secondary | ICD-10-CM

## 2015-05-13 DIAGNOSIS — R5383 Other fatigue: Secondary | ICD-10-CM

## 2015-05-13 DIAGNOSIS — R002 Palpitations: Secondary | ICD-10-CM

## 2015-05-13 MED ORDER — METOPROLOL SUCCINATE ER 25 MG PO TB24: 25.0000 mg | ORAL_TABLET | Freq: Every day | ORAL | Status: DC | PRN

## 2015-05-13 NOTE — Patient Instructions (Signed)
Medication Instructions:  You may take Metoprolol succinate 25 mg once a day as needed for palpitations. Continue all other medications as listed.  Follow-Up: Follow up in 1 year with Dr. Marlou Porch.  You will receive a letter in the mail 2 months before you are due.  Please call us when you receive this letter to schedule your follow up appointment.  Thank you for choosing [facility]!!

## 2015-05-13 NOTE — Progress Notes (Signed)
Cardiology Office Note   Date:  05/13/2015   ID:  [name], Rhonda Reese 02, 1947, MRN [id number]  PCP:  Shirline Frees, MD  Cardiologist:   Candee Furbish, MD       History of Present Illness: Rhonda Reese is a 69 y.o. female who presents for follow-up of palpitations/evaluation of chest tightness. Has a history of hemachromatosis. Previously had a stress test in 2011 which was reassuring. Has been staying fairly active but noticed that she is getting more tired especially going up and down the stairs in her house. Can feel palpitations as well. Chest tightness can come and go can radiate to her neck at times. Had mild pain and pressure in chest, not necessarily when more active. At [facility] had a tough time. She feels a jolt in mid chest and cough and feels a boom. Can have days when she would be wiped out tired.  Stress. Happens oftens. In the past she was diagnosed with PVCs.  Her that monitor on 04/08/15 was reassuring only showing PACs and brief PAT. No atrial fibrillation. As described below.  Travels on mission trips. No syncope.   Ferritin WNL. Prior had elevated levels.   Has good days and bad. Also has occasional left upper and right upper abdominal discomfort.  Past Medical History  Diagnosis Date  . Memory loss   . HA (headache)   . Hemochromatosis     Past Surgical History  Procedure Laterality Date  . Vaginal hysterectomy    . Gallbladder surgery       Current Outpatient Prescriptions  Medication Sig Dispense Refill  . ARMOUR THYROID 60 MG tablet TK 1 T PO QAM  5  . aspirin 81 MG tablet Take 81 mg by mouth daily.    Marland Kitchen BESIVANCE 0.6 % SUSP INSTILL 1 DROP IN LEFT EYE TID  1  . DUREZOL 0.05 % EMUL INSTILL 1 DROP IN LEFT EYE TID  1  . hydrochlorothiazide (HYDRODIURIL) 25 MG tablet Take 25 mg by mouth daily.    Marland Kitchen omeprazole (PRILOSEC) 40 MG capsule TK ONE C PO  D  2   No current facility-administered medications for this visit.    Allergies:    Morphine and related and Other    Social History:  The patient  reports that she has never smoked. She has never used smokeless tobacco. She reports that she does not drink alcohol or use illicit drugs.   Family History:  The patient's family history includes Cancer in her mother; Diabetes in her father; Hemochromatosis in her mother; High Cholesterol in her father; High blood pressure in her father; Thyroid disease in her mother. Father had CHF. Lung cancer.    ROS:  Please see the history of present illness.   Otherwise, review of systems are positive for occasional leg swelling, insomnia times, shortness of breath, palpitations accompanying with cough. Chest pressure, skipped heartbeats, occasional abdominal discomfort, low back pain.   All other systems are reviewed and negative.    PHYSICAL EXAM: VS:  BP 140/66 mmHg  Pulse 75  Ht 5\' 2"  (1.575 m)  Wt 187 lb 12.8 oz (85.186 kg)  BMI 34.34 kg/m2  SpO2 97% , BMI Body mass index is 34.34 kg/(m^2). GEN: Well nourished, well developed, in no acute distress HEENT: normal Neck: no JVD, carotid bruits, or masses Cardiac: RRR; no murmurs, rubs, split S2 appreciated,no edema  Respiratory:  clear to auscultation bilaterally, normal work of breathing GI: soft, nontender, nondistended, + BS  MS: no deformity or atrophy Skin: warm and dry, no rash, hyperpigmented Neuro:  Strength and sensation are intact Psych: euthymic mood, full affect   EKG:  EKG is ordered today. The ekg ordered today demonstrates today 04/01/15-sinus rhythm, no other abnormalities   Recent Labs: 04/25/2015: ALT 26; BUN 12; Creatinine, Ser 0.70; Hemoglobin 15.4*; Platelets 303; Potassium 4.0; Sodium 142    Lipid Panel No results found for: CHOL, TRIG, HDL, CHOLHDL, VLDL, LDLCALC, LDLDIRECT    Wt Readings from Last 3 Encounters:  05/13/15 187 lb 12.8 oz (85.186 kg)  04/25/15 178 lb (80.74 kg)  04/01/15 184 lb (83.462 kg)      Other studies Reviewed: Additional  studies/ records that were reviewed today include: prior office notes, [place] stress 2011. Review of the above records demonstrates: as above Event monitor 04/08/15-sinus rhythm, no atrial fibrillation, occasional PACs, brief PAT. Echo 04/08/15-normal wall thickness, normal EF.  ASSESSMENT AND PLAN:  1.  Palpitations-overall, reassuring monitor with premature atrial contractions noted. She did have one 4 beat run of paroxysmal atrial tachycardia, fairly slow. She has been diagnosed in the past with PVCs. I do not see these in the monitor. NO atrial fibrillation. I will give her when necessary Toprol 25 mg to take if she is having a rough day of palpitations.  2. Atypical chest pain-previous nuclear stress test in 2011 was reassuring. Echo was reassuring showing no wall thickness. Normal ejection fraction.  3. Obesity - encourage weight loss. Unexplained 13 pound weight gain over the past 8 weeks.  4. Hemochromatosis-could have cardiovascular sequela. Thankfully, echo reassuring.    Current medicines are reviewed at length with the patient today.  The patient does not have concerns regarding medicines.  The following changes have been made:  no change  Labs/ tests ordered today include: ECHO and Event monitor No orders of the defined types were placed in this encounter.     Disposition:   FU with [facility] in one year  Signed, Candee Furbish, MD  05/13/2015 9:17 AM    [facility] [place], [place], [place]  [address] Phone: [phone]; Fax: [phone]

## 2015-06-19 ENCOUNTER — Ambulatory Visit: Payer: Medicare Other | Admitting: Gastroenterology

## 2015-06-30 DIAGNOSIS — E538 Deficiency of other specified B group vitamins: Secondary | ICD-10-CM | POA: Diagnosis not present

## 2015-06-30 DIAGNOSIS — E039 Hypothyroidism, unspecified: Secondary | ICD-10-CM | POA: Diagnosis not present

## 2015-06-30 DIAGNOSIS — R002 Palpitations: Secondary | ICD-10-CM | POA: Diagnosis not present

## 2015-06-30 DIAGNOSIS — K588 Other irritable bowel syndrome: Secondary | ICD-10-CM | POA: Diagnosis not present

## 2015-10-14 DIAGNOSIS — D225 Melanocytic nevi of trunk: Secondary | ICD-10-CM | POA: Diagnosis not present

## 2015-10-14 DIAGNOSIS — D2362 Other benign neoplasm of skin of left upper limb, including shoulder: Secondary | ICD-10-CM | POA: Diagnosis not present

## 2015-10-14 DIAGNOSIS — L821 Other seborrheic keratosis: Secondary | ICD-10-CM | POA: Diagnosis not present

## 2015-12-09 DIAGNOSIS — K588 Other irritable bowel syndrome: Secondary | ICD-10-CM | POA: Diagnosis not present

## 2015-12-09 DIAGNOSIS — E039 Hypothyroidism, unspecified: Secondary | ICD-10-CM | POA: Diagnosis not present

## 2015-12-09 DIAGNOSIS — R609 Edema, unspecified: Secondary | ICD-10-CM | POA: Diagnosis not present

## 2015-12-09 DIAGNOSIS — R002 Palpitations: Secondary | ICD-10-CM | POA: Diagnosis not present

## 2015-12-09 DIAGNOSIS — Z23 Encounter for immunization: Secondary | ICD-10-CM | POA: Diagnosis not present

## 2015-12-09 DIAGNOSIS — K219 Gastro-esophageal reflux disease without esophagitis: Secondary | ICD-10-CM | POA: Diagnosis not present

## 2016-05-20 DIAGNOSIS — Z1231 Encounter for screening mammogram for malignant neoplasm of breast: Secondary | ICD-10-CM | POA: Diagnosis not present

## 2016-07-06 DIAGNOSIS — E559 Vitamin D deficiency, unspecified: Secondary | ICD-10-CM | POA: Diagnosis not present

## 2016-07-06 DIAGNOSIS — E039 Hypothyroidism, unspecified: Secondary | ICD-10-CM | POA: Diagnosis not present

## 2016-07-06 DIAGNOSIS — E569 Vitamin deficiency, unspecified: Secondary | ICD-10-CM | POA: Diagnosis not present

## 2016-07-06 DIAGNOSIS — D510 Vitamin B12 deficiency anemia due to intrinsic factor deficiency: Secondary | ICD-10-CM | POA: Diagnosis not present

## 2016-07-06 DIAGNOSIS — R799 Abnormal finding of blood chemistry, unspecified: Secondary | ICD-10-CM | POA: Diagnosis not present

## 2016-07-06 DIAGNOSIS — R5382 Chronic fatigue, unspecified: Secondary | ICD-10-CM | POA: Diagnosis not present

## 2016-08-20 DIAGNOSIS — E559 Vitamin D deficiency, unspecified: Secondary | ICD-10-CM | POA: Diagnosis not present

## 2016-08-20 DIAGNOSIS — R5382 Chronic fatigue, unspecified: Secondary | ICD-10-CM | POA: Diagnosis not present

## 2016-08-20 DIAGNOSIS — I7090 Unspecified atherosclerosis: Secondary | ICD-10-CM | POA: Diagnosis not present

## 2016-08-20 DIAGNOSIS — N951 Menopausal and female climacteric states: Secondary | ICD-10-CM | POA: Diagnosis not present

## 2016-08-20 DIAGNOSIS — E569 Vitamin deficiency, unspecified: Secondary | ICD-10-CM | POA: Diagnosis not present

## 2016-08-20 DIAGNOSIS — E039 Hypothyroidism, unspecified: Secondary | ICD-10-CM | POA: Diagnosis not present

## 2016-08-20 DIAGNOSIS — Z780 Asymptomatic menopausal state: Secondary | ICD-10-CM | POA: Diagnosis not present

## 2016-08-20 DIAGNOSIS — D510 Vitamin B12 deficiency anemia due to intrinsic factor deficiency: Secondary | ICD-10-CM | POA: Diagnosis not present

## 2016-08-20 DIAGNOSIS — R799 Abnormal finding of blood chemistry, unspecified: Secondary | ICD-10-CM | POA: Diagnosis not present

## 2016-08-24 ENCOUNTER — Other Ambulatory Visit: Payer: Self-pay | Admitting: Family Medicine

## 2016-08-24 DIAGNOSIS — Z780 Asymptomatic menopausal state: Secondary | ICD-10-CM | POA: Diagnosis not present

## 2016-08-24 DIAGNOSIS — D510 Vitamin B12 deficiency anemia due to intrinsic factor deficiency: Secondary | ICD-10-CM | POA: Diagnosis not present

## 2016-08-24 DIAGNOSIS — E038 Other specified hypothyroidism: Secondary | ICD-10-CM

## 2016-08-24 DIAGNOSIS — R799 Abnormal finding of blood chemistry, unspecified: Secondary | ICD-10-CM | POA: Diagnosis not present

## 2016-08-24 DIAGNOSIS — E039 Hypothyroidism, unspecified: Secondary | ICD-10-CM | POA: Diagnosis not present

## 2016-08-24 DIAGNOSIS — R5382 Chronic fatigue, unspecified: Secondary | ICD-10-CM | POA: Diagnosis not present

## 2016-08-24 DIAGNOSIS — E559 Vitamin D deficiency, unspecified: Secondary | ICD-10-CM | POA: Diagnosis not present

## 2016-08-24 DIAGNOSIS — E569 Vitamin deficiency, unspecified: Secondary | ICD-10-CM | POA: Diagnosis not present

## 2016-08-24 DIAGNOSIS — M542 Cervicalgia: Secondary | ICD-10-CM

## 2016-08-30 ENCOUNTER — Ambulatory Visit
Admission: RE | Admit: 2016-08-30 | Discharge: 2016-08-30 | Disposition: A | Discharge status: Home / Self Care | Payer: Medicare Other | Source: Ambulatory Visit | Attending: Family Medicine | Admitting: Family Medicine

## 2016-08-30 DIAGNOSIS — E038 Other specified hypothyroidism: Secondary | ICD-10-CM

## 2016-08-30 DIAGNOSIS — I6523 Occlusion and stenosis of bilateral carotid arteries: Secondary | ICD-10-CM | POA: Diagnosis not present

## 2016-08-30 DIAGNOSIS — M542 Cervicalgia: Secondary | ICD-10-CM

## 2016-08-30 DIAGNOSIS — E042 Nontoxic multinodular goiter: Secondary | ICD-10-CM | POA: Diagnosis not present

## 2016-10-14 ENCOUNTER — Other Ambulatory Visit: Payer: Self-pay | Admitting: Cardiology

## 2016-11-04 ENCOUNTER — Encounter: Payer: Self-pay | Admitting: Cardiology

## 2016-11-04 ENCOUNTER — Encounter: Payer: Self-pay | Admitting: *Deleted

## 2016-11-11 ENCOUNTER — Other Ambulatory Visit: Payer: Self-pay | Admitting: Cardiology

## 2016-11-19 ENCOUNTER — Ambulatory Visit: Payer: Medicare Other | Admitting: Cardiology

## 2016-12-02 ENCOUNTER — Ambulatory Visit: Payer: Medicare Other | Admitting: Cardiology

## 2016-12-09 DIAGNOSIS — J020 Streptococcal pharyngitis: Secondary | ICD-10-CM | POA: Diagnosis not present

## 2016-12-10 ENCOUNTER — Ambulatory Visit: Payer: Medicare Other | Admitting: Cardiology

## 2016-12-10 NOTE — Progress Notes (Deleted)
Cardiology Office Note   Date:  12/10/2016   ID:  Rhonda, Reese May 15, 1946, MRN [id number]  PCP:  Shirline Frees, MD  Cardiologist:   Candee Furbish, MD       History of Present Illness: Rhonda Reese is a 71 y.o. female who presents for follow-up of palpitations/evaluation of chest tightness. Has a history of hemachromatosis. Previously had a stress test in 2011 which was reassuring. Has been staying fairly active but noticed that she is getting more tired especially going up and down the stairs in her house. Can feel palpitations as well. Chest tightness can come and go can radiate to her neck at times. Had mild pain and pressure in chest, not necessarily when more active. At [facility] had a tough time. She feels a jolt in mid chest and cough and feels a boom. Can have days when she would be wiped out tired.  Stress. Happens oftens. In the past she was diagnosed with PVCs.  Her that monitor on 04/08/15 was reassuring only showing PACs and brief PAT. No atrial fibrillation. As described below.  Travels on mission trips. No syncope.   Ferritin WNL. Prior had elevated levels.   Has good days and bad. Also has occasional left upper and right upper abdominal discomfort.  Past Medical History:  Diagnosis Date  . Asthma   . Diverticulosis   . HA (headache)   . Hemochromatosis   . Memory loss   . Seasonal allergies     Past Surgical History:  Procedure Laterality Date  . GALLBLADDER SURGERY    . VAGINAL HYSTERECTOMY       Current Outpatient Prescriptions  Medication Sig Dispense Refill  . ARMOUR THYROID 60 MG tablet Take 1 tablet by mouth in the morning  5  . aspirin 81 MG tablet Take 81 mg by mouth daily.    Marland Kitchen BESIVANCE 0.6 % SUSP INSTILL 1 DROP IN LEFT EYE THREE TIMES A DAY  1  . DUREZOL 0.05 % EMUL INSTILL 1 DROP IN LEFT EYE THREE TIMES A DAY  1  . hydrochlorothiazide (HYDRODIURIL) 25 MG tablet Take 25 mg by mouth daily.    . metoprolol succinate (TOPROL-XL) 25  MG 24 hr tablet TAKE ONE TABLET BY MOUTH DAILY AS NEEDED FOR PALPITATIONS 30 tablet 0  . omeprazole (PRILOSEC) 40 MG capsule TAKE ONE CAPSULE BY MOUTH DAILY  2   No current facility-administered medications for this visit.     Allergies:   Morphine and related and Other    Social History:  The patient  reports that she has never smoked. She has never used smokeless tobacco. She reports that she does not drink alcohol or use drugs.   Family History:  The patient's family history includes Cancer in her mother; Diabetes in her father; Hemochromatosis in her mother; High Cholesterol in her father; High blood pressure in her father; Thyroid disease in her mother. Father had CHF. Lung cancer.    ROS:  Please see the history of present illness.   Otherwise, review of systems are positive for occasional leg swelling, insomnia times, shortness of breath, palpitations accompanying with cough. Chest pressure, skipped heartbeats, occasional abdominal discomfort, low back pain.   All other systems are reviewed and negative.    PHYSICAL EXAM: VS:  There were no vitals taken for this visit. , BMI There is no height or weight on file to calculate BMI. GEN: Well nourished, well developed, in no acute distress HEENT: normal Neck: no  JVD, carotid bruits, or masses Cardiac: RRR; no murmurs, rubs, split S2 appreciated,no edema  Respiratory:  clear to auscultation bilaterally, normal work of breathing GI: soft, nontender, nondistended, + BS MS: no deformity or atrophy Skin: warm and dry, no rash, hyperpigmented Neuro:  Strength and sensation are intact Psych: euthymic mood, full affect   EKG:  EKG is ordered today. The ekg ordered today demonstrates today 04/01/15-sinus rhythm, no other abnormalities   Recent Labs: No results found for requested labs within last 8760 hours.    Lipid Panel No results found for: CHOL, TRIG, HDL, CHOLHDL, VLDL, LDLCALC, LDLDIRECT    Wt Readings from Last 3  Encounters:  05/13/15 187 lb 12.8 oz (85.2 kg)  04/25/15 178 lb (80.7 kg)  04/01/15 184 lb (83.5 kg)      Other studies Reviewed: Additional studies/ records that were reviewed today include: prior office notes, [place] stress 2011. Review of the above records demonstrates: as above Event monitor 04/08/15-sinus rhythm, no atrial fibrillation, occasional PACs, brief PAT. Echo 04/08/15-normal wall thickness, normal EF.  ASSESSMENT AND PLAN:  1.  Palpitations-overall, reassuring monitor with premature atrial contractions noted. She did have one 4 beat run of paroxysmal atrial tachycardia, fairly slow. She has been diagnosed in the past with PVCs. I do not see these in the monitor. NO atrial fibrillation. I will give her when necessary Toprol 25 mg to take if she is having a rough day of palpitations.  2. Atypical chest pain-previous nuclear stress test in 2011 was reassuring. Echo was reassuring showing no wall thickness. Normal ejection fraction.  3. Obesity - encourage weight loss. Unexplained 13 pound weight gain over the past 8 weeks.  4. Hemochromatosis-could have cardiovascular sequela. Thankfully, echo reassuring.    Current medicines are reviewed at length with the patient today.  The patient does not have concerns regarding medicines.  The following changes have been made:  no change  Labs/ tests ordered today include: ECHO and Event monitor No orders of the defined types were placed in this encounter.    Disposition:   FU with [facility] in one year  Signed, Candee Furbish, MD  12/10/2016 6:32 AM    [facility] [place], [place], [place]  [address] Phone: [phone]; Fax: [phone]

## 2016-12-15 DIAGNOSIS — R5382 Chronic fatigue, unspecified: Secondary | ICD-10-CM | POA: Diagnosis not present

## 2016-12-15 DIAGNOSIS — R799 Abnormal finding of blood chemistry, unspecified: Secondary | ICD-10-CM | POA: Diagnosis not present

## 2016-12-15 DIAGNOSIS — Z780 Asymptomatic menopausal state: Secondary | ICD-10-CM | POA: Diagnosis not present

## 2016-12-15 DIAGNOSIS — Z139 Encounter for screening, unspecified: Secondary | ICD-10-CM | POA: Diagnosis not present

## 2016-12-15 DIAGNOSIS — E569 Vitamin deficiency, unspecified: Secondary | ICD-10-CM | POA: Diagnosis not present

## 2016-12-15 DIAGNOSIS — E039 Hypothyroidism, unspecified: Secondary | ICD-10-CM | POA: Diagnosis not present

## 2016-12-15 DIAGNOSIS — D510 Vitamin B12 deficiency anemia due to intrinsic factor deficiency: Secondary | ICD-10-CM | POA: Diagnosis not present

## 2016-12-15 DIAGNOSIS — E559 Vitamin D deficiency, unspecified: Secondary | ICD-10-CM | POA: Diagnosis not present

## 2016-12-17 ENCOUNTER — Encounter: Payer: Self-pay | Admitting: Cardiology

## 2017-01-04 DIAGNOSIS — B2709 Gammaherpesviral mononucleosis with other complications: Secondary | ICD-10-CM | POA: Diagnosis not present

## 2017-01-04 DIAGNOSIS — N951 Menopausal and female climacteric states: Secondary | ICD-10-CM | POA: Diagnosis not present

## 2017-01-04 DIAGNOSIS — R5383 Other fatigue: Secondary | ICD-10-CM | POA: Diagnosis not present

## 2017-01-04 DIAGNOSIS — E039 Hypothyroidism, unspecified: Secondary | ICD-10-CM | POA: Diagnosis not present

## 2017-01-10 DIAGNOSIS — Z139 Encounter for screening, unspecified: Secondary | ICD-10-CM | POA: Diagnosis not present

## 2017-01-10 DIAGNOSIS — E569 Vitamin deficiency, unspecified: Secondary | ICD-10-CM | POA: Diagnosis not present

## 2017-01-10 DIAGNOSIS — E559 Vitamin D deficiency, unspecified: Secondary | ICD-10-CM | POA: Diagnosis not present

## 2017-01-10 DIAGNOSIS — R799 Abnormal finding of blood chemistry, unspecified: Secondary | ICD-10-CM | POA: Diagnosis not present

## 2017-01-10 DIAGNOSIS — R5382 Chronic fatigue, unspecified: Secondary | ICD-10-CM | POA: Diagnosis not present

## 2017-01-10 DIAGNOSIS — D510 Vitamin B12 deficiency anemia due to intrinsic factor deficiency: Secondary | ICD-10-CM | POA: Diagnosis not present

## 2017-01-10 DIAGNOSIS — E039 Hypothyroidism, unspecified: Secondary | ICD-10-CM | POA: Diagnosis not present

## 2017-01-31 DIAGNOSIS — E569 Vitamin deficiency, unspecified: Secondary | ICD-10-CM | POA: Diagnosis not present

## 2017-01-31 DIAGNOSIS — Z79899 Other long term (current) drug therapy: Secondary | ICD-10-CM | POA: Diagnosis not present

## 2017-01-31 DIAGNOSIS — R799 Abnormal finding of blood chemistry, unspecified: Secondary | ICD-10-CM | POA: Diagnosis not present

## 2017-01-31 DIAGNOSIS — E559 Vitamin D deficiency, unspecified: Secondary | ICD-10-CM | POA: Diagnosis not present

## 2017-01-31 DIAGNOSIS — D510 Vitamin B12 deficiency anemia due to intrinsic factor deficiency: Secondary | ICD-10-CM | POA: Diagnosis not present

## 2017-01-31 DIAGNOSIS — I7090 Unspecified atherosclerosis: Secondary | ICD-10-CM | POA: Diagnosis not present

## 2017-01-31 DIAGNOSIS — E039 Hypothyroidism, unspecified: Secondary | ICD-10-CM | POA: Diagnosis not present

## 2017-01-31 DIAGNOSIS — Z139 Encounter for screening, unspecified: Secondary | ICD-10-CM | POA: Diagnosis not present

## 2017-01-31 DIAGNOSIS — R5382 Chronic fatigue, unspecified: Secondary | ICD-10-CM | POA: Diagnosis not present

## 2017-02-17 DIAGNOSIS — D239 Other benign neoplasm of skin, unspecified: Secondary | ICD-10-CM | POA: Diagnosis not present

## 2017-02-17 DIAGNOSIS — L821 Other seborrheic keratosis: Secondary | ICD-10-CM | POA: Diagnosis not present

## 2017-02-17 DIAGNOSIS — L814 Other melanin hyperpigmentation: Secondary | ICD-10-CM | POA: Diagnosis not present

## 2017-02-17 DIAGNOSIS — L739 Follicular disorder, unspecified: Secondary | ICD-10-CM | POA: Diagnosis not present

## 2017-02-17 DIAGNOSIS — D1801 Hemangioma of skin and subcutaneous tissue: Secondary | ICD-10-CM | POA: Diagnosis not present

## 2017-02-22 DIAGNOSIS — N952 Postmenopausal atrophic vaginitis: Secondary | ICD-10-CM | POA: Diagnosis not present

## 2017-02-22 DIAGNOSIS — Z8041 Family history of malignant neoplasm of ovary: Secondary | ICD-10-CM | POA: Diagnosis not present

## 2017-02-22 DIAGNOSIS — Z1389 Encounter for screening for other disorder: Secondary | ICD-10-CM | POA: Diagnosis not present

## 2017-03-04 ENCOUNTER — Ambulatory Visit (INDEPENDENT_AMBULATORY_CARE_PROVIDER_SITE_OTHER): Payer: Medicare Other | Admitting: Cardiology

## 2017-03-04 ENCOUNTER — Encounter: Payer: Self-pay | Admitting: Cardiology

## 2017-03-04 VITALS — BP 134/70 | HR 87 | Ht 63.0 in | Wt 186.0 lb

## 2017-03-04 DIAGNOSIS — R002 Palpitations: Secondary | ICD-10-CM

## 2017-03-04 DIAGNOSIS — R5383 Other fatigue: Secondary | ICD-10-CM

## 2017-03-04 DIAGNOSIS — E039 Hypothyroidism, unspecified: Secondary | ICD-10-CM

## 2017-03-04 DIAGNOSIS — R079 Chest pain, unspecified: Secondary | ICD-10-CM

## 2017-03-04 DIAGNOSIS — B2790 Infectious mononucleosis, unspecified without complication: Secondary | ICD-10-CM | POA: Diagnosis not present

## 2017-03-04 DIAGNOSIS — E8881 Metabolic syndrome: Secondary | ICD-10-CM | POA: Diagnosis not present

## 2017-03-04 MED ORDER — HYDROCHLOROTHIAZIDE 25 MG PO TABS: 25.0000 mg | ORAL_TABLET | Freq: Every day | ORAL | 3 refills | Status: DC

## 2017-03-04 MED ORDER — METOPROLOL SUCCINATE ER 25 MG PO TB24: ORAL_TABLET | ORAL | 3 refills | Status: DC
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
## 2017-03-04 NOTE — Patient Instructions (Signed)
Medication Instructions:  Your physician recommends that you continue on your current medications as directed. Please refer to the Current Medication list given to you today. 1. Both Toprol and HCTZ were sent in today to [facility]   Labwork: -None  Testing/Procedures: Your physician has requested that you have a lexiscan myoview. For further information please visit [email or web address]. Please follow instruction sheet, as given.    Follow-Up: Your physician recommends that you keep your scheduled  follow-up appointment with Cecilie Kicks, NP.   Any Other Special Instructions Will Be Listed Below (If Applicable).     If you need a refill on your cardiac medications before your next appointment, please call your pharmacy.

## 2017-03-07 ENCOUNTER — Telehealth (HOSPITAL_COMMUNITY): Payer: Self-pay | Admitting: *Deleted

## 2017-03-07 NOTE — Telephone Encounter (Signed)
Left message on voicemail in reference to upcoming appointment scheduled for 03/09/17. Phone number given for a call back so details instructions can be given.  Rhonda Reese Jacqueline   

## 2017-03-07 NOTE — Telephone Encounter (Signed)
Patient given detailed instructions per Myocardial Perfusion Study Information Sheet for the test on 03/09/17 at 7:30. Patient notified to arrive 15 minutes early and that it is imperative to arrive on time for appointment to keep from having the test rescheduled.  If you need to cancel or reschedule your appointment, please call the office within 24 hours of your appointment. . Patient verbalized understanding.Rhonda Reese

## 2017-03-09 ENCOUNTER — Ambulatory Visit (HOSPITAL_COMMUNITY): Payer: Medicare Other | Attending: Cardiology

## 2017-03-09 DIAGNOSIS — R079 Chest pain, unspecified: Secondary | ICD-10-CM | POA: Diagnosis not present

## 2017-03-09 DIAGNOSIS — R002 Palpitations: Secondary | ICD-10-CM | POA: Diagnosis not present

## 2017-03-09 LAB — MYOCARDIAL PERFUSION IMAGING
LV dias vol: 84 mL (ref 46–106)
LV sys vol: 24 mL
Peak HR: 98 {beats}/min
RATE: 0.24
Rest HR: 67 {beats}/min
SDS: 3
SRS: 0
SSS: 3
TID: 0.97

## 2017-03-09 MED ORDER — TECHNETIUM TC 99M TETROFOSMIN IV KIT
32.5000 | PACK | Freq: Once | INTRAVENOUS | Status: AC | PRN
Start: 2017-03-09 — End: 2017-03-09
  Administered 2017-03-09: 32.5 via INTRAVENOUS
  Filled 2017-03-09: qty 33

## 2017-03-09 MED ORDER — TECHNETIUM TC 99M TETROFOSMIN IV KIT
10.1000 | PACK | Freq: Once | INTRAVENOUS | Status: AC | PRN
  Administered 2017-03-09: 10.1 via INTRAVENOUS
  Filled 2017-03-09: qty 11

## 2017-03-09 MED ORDER — REGADENOSON 0.4 MG/5ML IV SOLN
0.4000 mg | Freq: Once | INTRAVENOUS | Status: AC
  Administered 2017-03-09: 0.4 mg via INTRAVENOUS

## 2017-03-10 ENCOUNTER — Telehealth: Payer: Self-pay

## 2017-03-10 NOTE — Telephone Encounter (Signed)
Pt is aware and agreeable to results. Reminded her to keep her 04/12/17 follow up appt with Cecilie Kicks, NP. She is agreeable.

## 2017-03-10 NOTE — Telephone Encounter (Signed)
lmtcb for results. 

## 2017-04-09 DIAGNOSIS — R5381 Other malaise: Secondary | ICD-10-CM | POA: Diagnosis not present

## 2017-04-12 ENCOUNTER — Ambulatory Visit (INDEPENDENT_AMBULATORY_CARE_PROVIDER_SITE_OTHER): Payer: Medicare Other | Admitting: Cardiology

## 2017-04-12 ENCOUNTER — Encounter: Payer: Self-pay | Admitting: Cardiology

## 2017-04-12 VITALS — BP 124/60 | HR 63 | Ht 63.0 in | Wt 187.1 lb

## 2017-04-12 DIAGNOSIS — R002 Palpitations: Secondary | ICD-10-CM

## 2017-04-12 DIAGNOSIS — R079 Chest pain, unspecified: Secondary | ICD-10-CM

## 2017-04-12 NOTE — Patient Instructions (Signed)
Medication Instructions:  Your physician recommends that you continue on your current medications as directed. Please refer to the Current Medication list given to you today.   Labwork: None ordered  Testing/Procedures: None ordered  Follow-Up: Your physician wants you to follow-up in: 1 year with Dr. Marlou Porch. You will receive a reminder letter in the mail two months in advance. If you don't receive a letter, please call our office to schedule the follow-up appointment.   Any Other Special Instructions Will Be Listed Below (If Applicable).     If you need a refill on your cardiac medications before your next appointment, please call your pharmacy.
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
## 2017-04-20 ENCOUNTER — Other Ambulatory Visit: Payer: Self-pay | Admitting: Cardiology

## 2017-04-20 ENCOUNTER — Telehealth: Payer: Self-pay | Admitting: Cardiology

## 2017-04-20 NOTE — Telephone Encounter (Signed)
New Message      *STAT* If patient is at the pharmacy, call can be transferred to refill team.   1. Which medications need to be refilled? (please list name of each medication and dose if known)  metoprolol succinate (TOPROL-XL) 25 MG 24 hr tablet TAKE ONE TABLET BY MOUTH DAILY AS NEEDED FOR PALPITATIONS     2. Which pharmacy/location (including street and city if local pharmacy) is medication to be sent to? [facility] on [address]   3. Do they need a 30 day or 90 day supply?  14  Was sent to the wrong pharmacy please correct

## 2017-07-20 ENCOUNTER — Encounter: Payer: Self-pay | Admitting: Gastroenterology

## 2017-07-25 DIAGNOSIS — H26493 Other secondary cataract, bilateral: Secondary | ICD-10-CM | POA: Diagnosis not present

## 2017-07-25 DIAGNOSIS — Z961 Presence of intraocular lens: Secondary | ICD-10-CM | POA: Diagnosis not present

## 2017-07-25 DIAGNOSIS — H26492 Other secondary cataract, left eye: Secondary | ICD-10-CM | POA: Diagnosis not present

## 2017-08-08 DIAGNOSIS — H26491 Other secondary cataract, right eye: Secondary | ICD-10-CM | POA: Diagnosis not present

## 2017-08-29 ENCOUNTER — Ambulatory Visit (AMBULATORY_SURGERY_CENTER): Payer: Self-pay | Admitting: *Deleted

## 2017-08-29 ENCOUNTER — Other Ambulatory Visit: Payer: Self-pay

## 2017-08-29 ENCOUNTER — Telehealth: Payer: Self-pay | Admitting: *Deleted

## 2017-08-29 VITALS — Ht 63.0 in | Wt 180.0 lb

## 2017-08-29 DIAGNOSIS — E559 Vitamin D deficiency, unspecified: Secondary | ICD-10-CM | POA: Diagnosis not present

## 2017-08-29 DIAGNOSIS — Z139 Encounter for screening, unspecified: Secondary | ICD-10-CM | POA: Diagnosis not present

## 2017-08-29 DIAGNOSIS — Z79899 Other long term (current) drug therapy: Secondary | ICD-10-CM | POA: Diagnosis not present

## 2017-08-29 DIAGNOSIS — I7090 Unspecified atherosclerosis: Secondary | ICD-10-CM | POA: Diagnosis not present

## 2017-08-29 DIAGNOSIS — D510 Vitamin B12 deficiency anemia due to intrinsic factor deficiency: Secondary | ICD-10-CM | POA: Diagnosis not present

## 2017-08-29 DIAGNOSIS — Z780 Asymptomatic menopausal state: Secondary | ICD-10-CM | POA: Diagnosis not present

## 2017-08-29 DIAGNOSIS — E569 Vitamin deficiency, unspecified: Secondary | ICD-10-CM | POA: Diagnosis not present

## 2017-08-29 DIAGNOSIS — Z1211 Encounter for screening for malignant neoplasm of colon: Secondary | ICD-10-CM

## 2017-08-29 DIAGNOSIS — E039 Hypothyroidism, unspecified: Secondary | ICD-10-CM | POA: Diagnosis not present

## 2017-08-29 DIAGNOSIS — R799 Abnormal finding of blood chemistry, unspecified: Secondary | ICD-10-CM | POA: Diagnosis not present

## 2017-08-29 DIAGNOSIS — R5382 Chronic fatigue, unspecified: Secondary | ICD-10-CM | POA: Diagnosis not present

## 2017-08-29 NOTE — Telephone Encounter (Signed)
Noted Dr. Doyne Keel advice.  Spoke with pt and told her to hold the Nattokinase until her procedure and understanding voiced.

## 2017-08-29 NOTE — Telephone Encounter (Signed)
Dr. Havery Moros,  This patient's procedure is this Friday.  She take Nattokinase for her high Ferritin level.. I just wanted to check on this because it's an OTC natural blood thinner.  Please advise, Cyril Mourning

## 2017-08-29 NOTE — Progress Notes (Addendum)
No egg or soy allergy  No intubation or anesthesia problems  No diet medications taken  No home oxygen used or hx of intubation  Pt takes Nattokinase, which is a natural blood thinner.  TE sent to Dr. Havery Moros to address this.    No diet medications taken  Pt was originally scheduled with Dr. Fuller Plan -she is a former Dr. Olevia Perches patient and wishes to changed to Dr. Havery Moros because her husband sees him.. She has never seen Dr. Fuller Plan before.

## 2017-08-29 NOTE — Telephone Encounter (Signed)
Hi Kristen, There are no guidelines on how to manage this around the time of endoscopy, it is not regulated by FDA.  It appears mechanism of action is similar to aspirin regarding bleeding risk. I would ask that she stop it at this time and will tell her when to resume it postprocedure. Thanks for asking.

## 2017-09-01 ENCOUNTER — Telehealth: Payer: Self-pay | Admitting: Gastroenterology

## 2017-09-01 NOTE — Telephone Encounter (Signed)
I spoke with her husband tonight.  She started prep for colonoscopy tomorrow and is in severe abdominal pains, vomiting profusely, abd a bit distended.  Some stool output but not much so far.  I could hear her in the background vomiting and moaning in pain.  This is a pretty extreme response to prep, wonder if underlying anatomic issue (PSBO?) and I advised she stop the prep and be taken to ER tonight for evaluation.  Ardine Bjork

## 2017-09-02 ENCOUNTER — Ambulatory Visit (AMBULATORY_SURGERY_CENTER): Payer: Medicare Other | Admitting: Gastroenterology

## 2017-09-02 ENCOUNTER — Encounter: Payer: Self-pay | Admitting: Gastroenterology

## 2017-09-02 ENCOUNTER — Other Ambulatory Visit: Payer: Self-pay

## 2017-09-02 VITALS — BP 143/68 | HR 72 | Temp 97.3°F | Resp 19 | Ht 63.0 in | Wt 180.0 lb

## 2017-09-02 DIAGNOSIS — D122 Benign neoplasm of ascending colon: Secondary | ICD-10-CM | POA: Diagnosis not present

## 2017-09-02 DIAGNOSIS — D123 Benign neoplasm of transverse colon: Secondary | ICD-10-CM | POA: Diagnosis not present

## 2017-09-02 DIAGNOSIS — E669 Obesity, unspecified: Secondary | ICD-10-CM | POA: Diagnosis not present

## 2017-09-02 DIAGNOSIS — Z1211 Encounter for screening for malignant neoplasm of colon: Secondary | ICD-10-CM | POA: Diagnosis present

## 2017-09-02 MED ORDER — SODIUM CHLORIDE 0.9 % IV SOLN: 500.0000 mL | Freq: Once | INTRAVENOUS | Status: AC

## 2017-09-02 NOTE — Telephone Encounter (Signed)
Thanks for the message Linna Hoff, sorry to hear this.  Lanelle Bal would you mind touching base with this patient to see how she is doing? Sounds like she won't be ready for a procedure today, not sure if she went to the ED or not (I don't see any record of this in Epic). I should probably see her in clinic prior to scheduling another procedure given this occurrence. Thanks

## 2017-09-02 NOTE — Progress Notes (Signed)
To recovery, report to RN, VSS. 

## 2017-09-02 NOTE — Progress Notes (Signed)
Called to room to assist during endoscopic procedure.  Patient ID and intended procedure confirmed with present staff. Received instructions for my participation in the procedure from the performing physician.  

## 2017-09-02 NOTE — Patient Instructions (Signed)
YOU HAD AN ENDOSCOPIC PROCEDURE TODAY AT [facility]:   Refer to the procedure report that was given to you for any specific questions about what was found during the examination.  If the procedure report does not answer your questions, please call your gastroenterologist to clarify.  If you requested that your care partner not be given the details of your procedure findings, then the procedure report has been included in a sealed envelope for you to review at your convenience later.  YOU SHOULD EXPECT: Some feelings of bloating in the abdomen. Passage of more gas than usual.  Walking can help get rid of the air that was put into your GI tract during the procedure and reduce the bloating. If you had a lower endoscopy (such as a colonoscopy or flexible sigmoidoscopy) you may notice spotting of blood in your stool or on the toilet paper. If you underwent a bowel prep for your procedure, you may not have a normal bowel movement for a few days.  Please Note:  You might notice some irritation and congestion in your nose or some drainage.  This is from the oxygen used during your procedure.  There is no need for concern and it should clear up in a day or so.  SYMPTOMS TO REPORT IMMEDIATELY:   Following lower endoscopy (colonoscopy or flexible sigmoidoscopy):  Excessive amounts of blood in the stool  Significant tenderness or worsening of abdominal pains  Swelling of the abdomen that is new, acute  Fever of 100F or higher     For urgent or emergent issues, a gastroenterologist can be reached at any hour by calling [phone].   DIET:  We do recommend a small meal at first, but then you may proceed to your regular diet.  Drink plenty of fluids but you should avoid alcoholic beverages for 24 hours.  ACTIVITY:  You should plan to take it easy for the rest of today and you should NOT DRIVE or use heavy machinery until tomorrow (because of the sedation medicines used during the  test).    FOLLOW UP: Our staff will call the number listed on your records the next business day following your procedure to check on you and address any questions or concerns that you may have regarding the information given to you following your procedure. If we do not reach you, we will leave a message.  However, if you are feeling well and you are not experiencing any problems, there is no need to return our call.  We will assume that you have returned to your regular daily activities without incident.  If any biopsies were taken you will be contacted by phone or by letter within the next 1-3 weeks.  Please call us at [phone] if you have not heard about the biopsies in 3 weeks.   Polyp, diverticulosis, and hemorrhoid information given., No ibuprofen, naproxen, or other non steroidal anti inflammatory meds for 2 weeks.   SIGNATURES/CONFIDENTIALITY: You and/or your care partner have signed paperwork which will be entered into your electronic medical record.  These signatures attest to the fact that that the information above on your After Visit Summary has been reviewed and is understood.  Full responsibility of the confidentiality of this discharge information lies with you and/or your care-partner.

## 2017-09-02 NOTE — Op Note (Signed)
[facility] Patient Name: Rhonda Reese Procedure Date: 09/02/2017 10:25 AM MRN: [id number] Endoscopist: Remo Lipps P. Sinan Tuch MD, MD Age: 72 Referring MD:  Date of Birth: Apr 25, 1946 Gender: Female Account #: [id number] Procedure:                Colonoscopy Indications:              Screening for colorectal malignant neoplasm Medicines:                Monitored Anesthesia Care Procedure:                Pre-Anesthesia Assessment:                           - Prior to the procedure, a History and Physical                            was performed, and patient medications and                            allergies were reviewed. The patient's tolerance of                            previous anesthesia was also reviewed. The risks                            and benefits of the procedure and the sedation                            options and risks were discussed with the patient.                            All questions were answered, and informed consent                            was obtained. Prior Anticoagulants: The patient has                            taken antiplatelet medication (Nattokinase), last                            dose was 4 days prior to procedure. ASA Grade                            Assessment: II - A patient with mild systemic                            disease. After reviewing the risks and benefits,                            the patient was deemed in satisfactory condition to                            undergo the procedure.  After obtaining informed consent, the colonoscope                            was passed under direct vision. Throughout the                            procedure, the patient's blood pressure, pulse, and                            oxygen saturations were monitored continuously. The                            Colonoscope was introduced through the anus and                            advanced to the the cecum,  identified by                            appendiceal orifice and ileocecal valve. The                            colonoscopy was performed without difficulty. The                            patient tolerated the procedure well. The quality                            of the bowel preparation was good. The ileocecal                            valve, appendiceal orifice, and rectum were                            photographed. Scope In: 10:28:52 AM Scope Out: 10:46:23 AM Scope Withdrawal Time: 0 hours 13 minutes 43 seconds  Total Procedure Duration: 0 hours 17 minutes 31 seconds  Findings:                 The perianal and digital rectal examinations were                            normal.                           Moderate melanosis coli was found in the entire                            colon.                           Two sessile polyps were found in the ascending                            colon. The polyps were 4 to 8 mm in size. These  polyps were removed with a cold snare. Resection                            and retrieval were complete.                           A 6 mm polyp was found in the transverse colon. The                            polyp was sessile. The polyp was removed with a                            cold snare. Resection and retrieval were complete.                           A few medium-mouthed diverticula were found in the                            sigmoid colon.                           Internal hemorrhoids were found during                            retroflexion. The hemorrhoids were small.                           The exam was otherwise without abnormality. Complications:            No immediate complications. Estimated blood loss:                            Minimal. Estimated Blood Loss:     Estimated blood loss was minimal. 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                           - Two 4 to 8 mm  polyps in the ascending colon,                            removed with a cold snare. Resected and retrieved.                           - One 6 mm polyp in the transverse colon, removed                            with a cold snare. Resected and retrieved.                           - Diverticulosis in the sigmoid colon.                           - Internal hemorrhoids.                           -  The examination was otherwise normal. Recommendation:           - Patient has a contact number available for                            emergencies. The signs and symptoms of potential                            delayed complications were discussed with the                            patient. Return to normal activities tomorrow.                            Written discharge instructions were provided to the                            patient.                           - Resume previous diet.                           - Continue present medications.                           - Resume Nattokinase in 2 days                           - Await pathology results.                           - Repeat colonoscopy is recommended for                            surveillance. The colonoscopy date will be                            determined after pathology results from today's                            exam become available for review.                           - No ibuprofen, naproxen, or other non-steroidal                            anti-inflammatory drugs for 2 weeks after polyp                            removal. Remo Lipps P. Kawon Willcutt MD, MD 09/02/2017 10:51:15 AM This report has been signed electronically.

## 2017-09-02 NOTE — Telephone Encounter (Signed)
Patient called in stating that everything went well after she talked to Dr. Ardis Hughs last night. She says that she will be coming in today for her procedure.

## 2017-09-05 ENCOUNTER — Telehealth: Payer: Self-pay | Admitting: Gastroenterology

## 2017-09-05 ENCOUNTER — Telehealth: Payer: Self-pay | Admitting: *Deleted

## 2017-09-05 NOTE — Telephone Encounter (Signed)
No answer, message left for the patient. 

## 2017-09-05 NOTE — Telephone Encounter (Signed)
Rhonda Reese can you schedule this patient in first available opening. This is a chronic condition for her, I don't think March is too far away if that is what is open, and can place on wait list and call her in the interim if something opens up. Thanks

## 2017-09-05 NOTE — Telephone Encounter (Signed)
The pt was advised and appt scheduled for March.

## 2017-09-08 ENCOUNTER — Encounter: Payer: Self-pay | Admitting: Gastroenterology

## 2017-09-12 ENCOUNTER — Encounter: Payer: Medicare Other | Admitting: Gastroenterology

## 2017-10-21 ENCOUNTER — Encounter: Payer: Self-pay | Admitting: Gastroenterology

## 2017-10-21 ENCOUNTER — Ambulatory Visit (INDEPENDENT_AMBULATORY_CARE_PROVIDER_SITE_OTHER): Payer: Medicare Other | Admitting: Gastroenterology

## 2017-10-21 ENCOUNTER — Other Ambulatory Visit: Payer: Medicare Other

## 2017-10-21 VITALS — BP 160/86 | HR 80 | Ht 61.5 in | Wt 193.0 lb

## 2017-10-21 DIAGNOSIS — R7989 Other specified abnormal findings of blood chemistry: Secondary | ICD-10-CM

## 2017-10-21 DIAGNOSIS — Z1589 Genetic susceptibility to other disease: Secondary | ICD-10-CM

## 2017-10-21 DIAGNOSIS — E7212 Methylenetetrahydrofolate reductase deficiency: Secondary | ICD-10-CM

## 2017-10-21 NOTE — Progress Notes (Signed)
HPI :  72 y/o female here for a follow up visit. I saw her for her colonoscopy in January in which she had a few pre-cancerous polyps removed.   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  She denies any history of cirrhosis or diabetes.  She does have thyroid disorder for which she takes thyroid supplementation.  She brings historical records from her prior workup.  She has never had any prior genetic testing specifically for hemochromatosis.  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  She was told she had "thick blood" and a ferritin level of 400 in the past initially when diagnosed with this finding, and was recommended to have phlebotomy.  She states this diagnosis was made by a "integrative health" physician, and was told this is what was causing her chronic fatigue..  She is been taking multiple vitamin supplementations.  She is never had a prior liver biopsy.  She denies any alcohol use.  She does have a history of fatty liver disease noted on the CT scan in 2016.  She states her last episode of phlebotomy was about 1 year ago.    She provides recent labs from her primary care as of August 29, 2017 as follows:  AST 19, ALT 21, AP 78, T bil 0.2, ferritin 99, iron sat 24%, iron 71, TIBC 297  Hgb 14.8, TSH 1.29   CT scan 04/25/2015 - fatty liver  Colonoscopy 09/02/2017 - 3 polyps, diverticolosis, hemorrhoids - 2 polyps sessile serrated / adenomatous - follow up 5 years  Past Medical History:  Diagnosis Date  . Chronic kidney disease    kidney stones and infection  . Diverticulosis   . [facility] Patient infection   . GERD (gastroesophageal reflux disease)   . HA (headache)   . Hemochromatosis   . 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 Hyperlipidemia   . Memory loss   . Seasonal allergies   . Thyroid disease    reverse T3     Past Surgical History:  Procedure  Laterality Date  . CATARACT EXTRACTION, BILATERAL    . CHOLECYSTECTOMY    . COLONOSCOPY    . GALLBLADDER SURGERY    . VAGINAL HYSTERECTOMY     Family History  Problem Relation Age of Onset  . Thyroid disease Mother   . Hemochromatosis Mother   . Ovarian cancer Mother   . High blood pressure Father   . High Cholesterol Father   . Diabetes Father   . Heart failure Father   . Esophageal cancer Neg Hx   . Prostate cancer Neg Hx   . Rectal cancer Neg Hx   . Colon cancer Neg Hx    Social History   Tobacco Use  . Smoking status: Never Smoker  . Smokeless tobacco: Never Used  Substance Use Topics  . Alcohol use: No  . Drug use: No   Current Outpatient Medications  Medication Sig Dispense Refill  . ARMOUR THYROID 60 MG tablet Take 1 tablet by mouth in the morning  5  . aspirin 81 MG tablet Take 81 mg by mouth daily.    . Estradiol (VAGIFEM VA) Apply 1 Dose topically daily. Cream that pat rubs on arms    . hydrochlorothiazide (HYDRODIURIL) 25 MG tablet Take 1 tablet (25 mg total) by mouth daily. 90 tablet 3  . liothyronine (CYTOMEL) 5 MCG tablet Take  10 mcg by mouth daily.    . Melatonin 10 MG TABS Take 10 mg by mouth at bedtime as needed (sleep).    . metoprolol succinate (TOPROL-XL) 25 MG 24 hr tablet TAKE ONE TABLET BY MOUTH DAILY AS NEEDED FOR PALPITATIONS 90 tablet 3  . Nattokinase 100 MG CAPS Take 1 capsule by mouth 2 (two) times daily.    Marland Kitchen omeprazole (PRILOSEC) 40 MG capsule TAKE ONE CAPSULE BY MOUTH DAILY  2  . valACYclovir (VALTREX) 1000 MG tablet Take 1,000 mg by mouth daily.     Current Facility-Administered Medications  Medication Dose Route Frequency Provider Last Rate Last Dose  . 0.9 %  sodium chloride infusion  500 mL Intravenous Once Andrea Ferrer, Carlota Raspberry, MD       Allergies  Allergen Reactions  . Morphine And Related   . Other     Malaria meds* Palpitations and blurry vision     Review of Systems: All systems reviewed and negative except where noted in  HPI.    No results found.  Physical Exam: BP (!) 160/86 (BP Location: Left Arm, Patient Position: Sitting, Cuff Size: Normal)   Pulse 80   Ht 5' 1.5" (1.562 m) Comment: height measured without shoes  Wt 193 lb (87.5 kg)   BMI 35.88 kg/m  Constitutional: Pleasant,well-developed, female in no acute distress. HEENT: Normocephalic and atraumatic. Conjunctivae are normal. No scleral icterus. Neck supple.  Cardiovascular: Normal rate, regular rhythm.  Pulmonary/chest: Effort normal and breath sounds normal. No wheezing, rales or rhonchi. Abdominal: Soft, nondistended, nontender. There are no masses palpable. No hepatomegaly. Extremities: no edema Lymphadenopathy: No cervical adenopathy noted. Neurological: Alert and oriented to person place and time. Skin: Skin is warm and dry. No rashes noted. Psychiatric: Normal mood and affect. Behavior is normal.   ASSESSMENT AND PLAN: 72 year old female who presents for further evaluation for what she thought was hereditary hemochromatosis.  From what I can gather she has a homozygous MTHFR mutation.  I discussed what my understanding of that mutation was with her, risk for hyper homocystinemia and potential hypercoagulable states.  I am not aware that this causes hemochromatosis.  In fact I am not convinced she has hemochromatosis at all.  I do not see that she has had a prior elevated iron saturation and this diagnosis was based on moderately elevated ferritin, which I counseled can be due to a variety of causes.  She was rather recommend she was told she had hemochromatosis.  I will send blood test for hemochromatosis specific genes.  If this is negative I think it is unlikely she has hemochromatosis based on her iron studies in the past.  I do think she has fatty liver which we discussed the potential risk associated with this.  She agreed with the plan, I will let her know the results of this test when available.  She may consider a consultation for  hematology pending results of this and for further counseling about the MTHFR mutation.   Robesonia Cellar, MD [facility] Pager [phone]

## 2017-10-21 NOTE — Patient Instructions (Addendum)
If you are age 70 or older, your body mass index should be between 23-30. Your Body mass index is 35.88 kg/m. If this is out of the aforementioned range listed, please consider follow up with your Primary Care Provider.  If you are age 22 or younger, your body mass index should be between 19-25. Your Body mass index is 35.88 kg/m. If this is out of the aformentioned range listed, please consider follow up with your Primary Care Provider.   Please go to the lab in the basement of our building to have lab work done as you leave today.   Thank you for entrusting me with your care and for choosing [facility], Dr. Daguao Cellar

## 2017-10-25 LAB — HEMOCHROMATOSIS DNA-PCR(C282Y,H63D)

## 2017-11-03 ENCOUNTER — Encounter: Payer: Self-pay | Admitting: Gastroenterology

## 2017-12-16 DIAGNOSIS — D485 Neoplasm of uncertain behavior of skin: Secondary | ICD-10-CM | POA: Diagnosis not present

## 2017-12-16 DIAGNOSIS — Z1231 Encounter for screening mammogram for malignant neoplasm of breast: Secondary | ICD-10-CM | POA: Diagnosis not present

## 2017-12-16 DIAGNOSIS — L821 Other seborrheic keratosis: Secondary | ICD-10-CM | POA: Diagnosis not present

## 2017-12-16 DIAGNOSIS — L439 Lichen planus, unspecified: Secondary | ICD-10-CM | POA: Diagnosis not present

## 2017-12-29 DIAGNOSIS — R5382 Chronic fatigue, unspecified: Secondary | ICD-10-CM | POA: Diagnosis not present

## 2017-12-29 DIAGNOSIS — E569 Vitamin deficiency, unspecified: Secondary | ICD-10-CM | POA: Diagnosis not present

## 2017-12-29 DIAGNOSIS — D510 Vitamin B12 deficiency anemia due to intrinsic factor deficiency: Secondary | ICD-10-CM | POA: Diagnosis not present

## 2017-12-29 DIAGNOSIS — Z780 Asymptomatic menopausal state: Secondary | ICD-10-CM | POA: Diagnosis not present

## 2017-12-29 DIAGNOSIS — E039 Hypothyroidism, unspecified: Secondary | ICD-10-CM | POA: Diagnosis not present

## 2017-12-29 DIAGNOSIS — E559 Vitamin D deficiency, unspecified: Secondary | ICD-10-CM | POA: Diagnosis not present

## 2017-12-29 DIAGNOSIS — R799 Abnormal finding of blood chemistry, unspecified: Secondary | ICD-10-CM | POA: Diagnosis not present

## 2018-01-03 DIAGNOSIS — E538 Deficiency of other specified B group vitamins: Secondary | ICD-10-CM | POA: Diagnosis not present

## 2018-01-03 DIAGNOSIS — N951 Menopausal and female climacteric states: Secondary | ICD-10-CM | POA: Diagnosis not present

## 2018-04-14 DIAGNOSIS — Z6833 Body mass index (BMI) 33.0-33.9, adult: Secondary | ICD-10-CM | POA: Diagnosis not present

## 2018-04-14 DIAGNOSIS — Z01419 Encounter for gynecological examination (general) (routine) without abnormal findings: Secondary | ICD-10-CM | POA: Diagnosis not present

## 2018-04-14 DIAGNOSIS — N952 Postmenopausal atrophic vaginitis: Secondary | ICD-10-CM | POA: Diagnosis not present

## 2018-04-14 DIAGNOSIS — Z8041 Family history of malignant neoplasm of ovary: Secondary | ICD-10-CM | POA: Diagnosis not present

## 2018-04-14 DIAGNOSIS — R32 Unspecified urinary incontinence: Secondary | ICD-10-CM | POA: Diagnosis not present

## 2018-05-15 DIAGNOSIS — E039 Hypothyroidism, unspecified: Secondary | ICD-10-CM | POA: Diagnosis not present

## 2018-05-15 DIAGNOSIS — D510 Vitamin B12 deficiency anemia due to intrinsic factor deficiency: Secondary | ICD-10-CM | POA: Diagnosis not present

## 2018-05-15 DIAGNOSIS — E569 Vitamin deficiency, unspecified: Secondary | ICD-10-CM | POA: Diagnosis not present

## 2018-05-15 DIAGNOSIS — R799 Abnormal finding of blood chemistry, unspecified: Secondary | ICD-10-CM | POA: Diagnosis not present

## 2018-05-15 DIAGNOSIS — R5382 Chronic fatigue, unspecified: Secondary | ICD-10-CM | POA: Diagnosis not present

## 2018-05-15 DIAGNOSIS — Z780 Asymptomatic menopausal state: Secondary | ICD-10-CM | POA: Diagnosis not present

## 2018-05-15 DIAGNOSIS — E559 Vitamin D deficiency, unspecified: Secondary | ICD-10-CM | POA: Diagnosis not present

## 2018-06-03 DIAGNOSIS — M25562 Pain in left knee: Secondary | ICD-10-CM | POA: Diagnosis not present

## 2018-06-28 ENCOUNTER — Other Ambulatory Visit: Payer: Self-pay | Admitting: Cardiology

## 2018-06-28 DIAGNOSIS — R002 Palpitations: Secondary | ICD-10-CM

## 2018-06-28 DIAGNOSIS — R079 Chest pain, unspecified: Secondary | ICD-10-CM

## 2018-08-04 ENCOUNTER — Other Ambulatory Visit: Payer: Self-pay | Admitting: Cardiology

## 2018-08-04 DIAGNOSIS — R002 Palpitations: Secondary | ICD-10-CM

## 2018-08-04 DIAGNOSIS — R079 Chest pain, unspecified: Secondary | ICD-10-CM

## 2018-08-21 DIAGNOSIS — M4802 Spinal stenosis, cervical region: Secondary | ICD-10-CM | POA: Diagnosis not present

## 2018-08-21 DIAGNOSIS — M25511 Pain in right shoulder: Secondary | ICD-10-CM | POA: Diagnosis not present

## 2018-08-21 DIAGNOSIS — M7541 Impingement syndrome of right shoulder: Secondary | ICD-10-CM | POA: Diagnosis not present

## 2018-08-21 DIAGNOSIS — M542 Cervicalgia: Secondary | ICD-10-CM | POA: Diagnosis not present

## 2018-08-21 DIAGNOSIS — M170 Bilateral primary osteoarthritis of knee: Secondary | ICD-10-CM | POA: Diagnosis not present

## 2018-08-21 DIAGNOSIS — M25561 Pain in right knee: Secondary | ICD-10-CM | POA: Diagnosis not present

## 2018-08-30 ENCOUNTER — Other Ambulatory Visit: Payer: Self-pay | Admitting: Orthopaedic Surgery

## 2018-08-30 DIAGNOSIS — M4802 Spinal stenosis, cervical region: Secondary | ICD-10-CM

## 2018-09-05 ENCOUNTER — Ambulatory Visit
Admission: RE | Admit: 2018-09-05 | Discharge: 2018-09-05 | Disposition: A | Discharge status: Home / Self Care | Payer: Medicare Other | Source: Ambulatory Visit | Attending: Orthopaedic Surgery | Admitting: Orthopaedic Surgery

## 2018-09-05 DIAGNOSIS — M4802 Spinal stenosis, cervical region: Secondary | ICD-10-CM

## 2018-09-05 DIAGNOSIS — M542 Cervicalgia: Secondary | ICD-10-CM | POA: Diagnosis not present

## 2018-09-08 DIAGNOSIS — L814 Other melanin hyperpigmentation: Secondary | ICD-10-CM | POA: Diagnosis not present

## 2018-09-08 DIAGNOSIS — D2362 Other benign neoplasm of skin of left upper limb, including shoulder: Secondary | ICD-10-CM | POA: Diagnosis not present

## 2018-09-08 DIAGNOSIS — L821 Other seborrheic keratosis: Secondary | ICD-10-CM | POA: Diagnosis not present

## 2018-09-08 DIAGNOSIS — D225 Melanocytic nevi of trunk: Secondary | ICD-10-CM | POA: Diagnosis not present

## 2018-09-11 DIAGNOSIS — M4802 Spinal stenosis, cervical region: Secondary | ICD-10-CM | POA: Diagnosis not present

## 2018-09-11 DIAGNOSIS — M7541 Impingement syndrome of right shoulder: Secondary | ICD-10-CM | POA: Diagnosis not present

## 2018-09-11 DIAGNOSIS — M542 Cervicalgia: Secondary | ICD-10-CM | POA: Diagnosis not present

## 2018-09-26 DIAGNOSIS — M542 Cervicalgia: Secondary | ICD-10-CM | POA: Diagnosis not present

## 2018-09-26 DIAGNOSIS — M4802 Spinal stenosis, cervical region: Secondary | ICD-10-CM | POA: Diagnosis not present

## 2018-09-27 DIAGNOSIS — H903 Sensorineural hearing loss, bilateral: Secondary | ICD-10-CM | POA: Diagnosis not present

## 2018-09-27 DIAGNOSIS — R42 Dizziness and giddiness: Secondary | ICD-10-CM | POA: Diagnosis not present

## 2018-09-27 DIAGNOSIS — H6122 Impacted cerumen, left ear: Secondary | ICD-10-CM | POA: Diagnosis not present

## 2018-09-27 DIAGNOSIS — K210 Gastro-esophageal reflux disease with esophagitis: Secondary | ICD-10-CM | POA: Diagnosis not present

## 2018-10-02 DIAGNOSIS — M4802 Spinal stenosis, cervical region: Secondary | ICD-10-CM | POA: Diagnosis not present

## 2018-10-11 DIAGNOSIS — M4802 Spinal stenosis, cervical region: Secondary | ICD-10-CM | POA: Diagnosis not present

## 2018-11-07 ENCOUNTER — Other Ambulatory Visit: Payer: Self-pay | Admitting: Cardiology

## 2018-11-07 DIAGNOSIS — R002 Palpitations: Secondary | ICD-10-CM

## 2018-11-07 DIAGNOSIS — R079 Chest pain, unspecified: Secondary | ICD-10-CM

## 2018-11-07 MED ORDER — METOPROLOL SUCCINATE ER 25 MG PO TB24: ORAL_TABLET | ORAL | 0 refills | Status: DC

## 2018-11-07 NOTE — Telephone Encounter (Signed)
Pt's medication was sent to pt's pharmacy as requested. Confirmation received.  °

## 2018-11-28 DIAGNOSIS — M542 Cervicalgia: Secondary | ICD-10-CM | POA: Diagnosis not present

## 2018-12-15 DIAGNOSIS — K210 Gastro-esophageal reflux disease with esophagitis: Secondary | ICD-10-CM | POA: Diagnosis not present

## 2018-12-25 ENCOUNTER — Telehealth: Payer: Self-pay | Admitting: Cardiology

## 2018-12-25 NOTE — Telephone Encounter (Signed)
Pt. Has smartphone    Virtual Visit Pre-Appointment Phone Call  "(Name), I am calling you today to discuss your upcoming appointment. We are currently trying to limit exposure to the virus that causes COVID-19 by seeing patients at home rather than in the office."  1. "What is the BEST phone number to call the day of the visit?" - include this in appointment notes  2. Do you have or have access to (through a family member/friend) a smartphone with video capability that we can use for your visit?" a. If yes - list this number in appt notes as cell (if different from BEST phone #) and list the appointment type as a VIDEO visit in appointment notes b. If no - list the appointment type as a PHONE visit in appointment notes  3. Confirm consent - "In the setting of the current Covid19 crisis, you are scheduled for a (phone or video) visit with your provider on (date) at (time).  Just as we do with many in-office visits, in order for you to participate in this visit, we must obtain consent.  If you'd like, I can send this to your mychart (if signed up) or email for you to review.  Otherwise, I can obtain your verbal consent now.  All virtual visits are billed to your insurance company just like a normal visit would be.  By agreeing to a virtual visit, we'd like you to understand that the technology does not allow for your provider to perform an examination, and thus may limit your provider's ability to fully assess your condition. If your provider identifies any concerns that need to be evaluated in person, we will make arrangements to do so.  Finally, though the technology is pretty good, we cannot assure that it will always work on either your or our end, and in the setting of a video visit, we may have to convert it to a phone-only visit.  In either situation, we cannot ensure that we have a secure connection.  Are you willing to proceed?"  YES  4. Advise patient to be prepared - "Two hours prior to  your appointment, go ahead and check your blood pressure, pulse, oxygen saturation, and your weight (if you have the equipment to check those) and write them all down. When your visit starts, your provider will ask you for this information. If you have an Apple Watch or Kardia device, please plan to have heart rate information ready on the day of your appointment. Please have a pen and paper handy nearby the day of the visit as well."  5. Give patient instructions for MyChart download to smartphone OR [email or web address] as below if video visit (depending on what platform provider is using)  6. Inform patient they will receive a phone call 15 minutes prior to their appointment time (may be from unknown caller ID) so they should be prepared to answer    [place] has been deemed a candidate for a follow-up tele-health visit to limit community exposure during the Covid-19 pandemic. I spoke with the patient via phone to ensure availability of phone/video source, confirm preferred email & phone number, and discuss instructions and expectations.  I reminded SAMIYAH STUPKA to be prepared with any vital sign and/or heart rhythm information that could potentially be obtained via home monitoring, at the time of her visit. I reminded ALEIGHA GILANI to expect a phone call prior to her visit.  Minus Liberty 12/25/2018 9:37 AM  INSTRUCTIONS FOR DOWNLOADING THE MYCHART APP TO SMARTPHONE  - The patient must first make sure to have activated MyChart and know their login information - If Apple, go to [employer] and type in MyChart in the search bar and download the app. If Android, ask patient to go to [employer] and type in [place] in the search bar and download the app. The app is free but as with any other app downloads, their phone may require them to verify saved payment information or Apple/Android password.  - The patient will need to then log into the app with  their MyChart username and password, and select [facility] as their healthcare provider to link the account. When it is time for your visit, go to the MyChart app, find appointments, and click Begin Video Visit. Be sure to Select Allow for your device to access the Microphone and Camera for your visit. You will then be connected, and your provider will be with you shortly.  **If they have any issues connecting, or need assistance please contact MyChart service desk [phone])**  **If using a computer, in order to ensure the best quality for their visit they will need to use either of the following Internet Browsers: [employer], or Google Chrome**  IF USING DOXIMITY or DOXY.ME - The patient will receive a link just prior to their visit by text.     FULL LENGTH CONSENT FOR TELE-HEALTH VISIT   I hereby voluntarily request, consent and authorize [facility] and its employed or contracted physicians, physician assistants, nurse practitioners or other licensed health care professionals (the Practitioner), to provide me with telemedicine health care services (the Services") as deemed necessary by the treating Practitioner. I acknowledge and consent to receive the Services by the Practitioner via telemedicine. I understand that the telemedicine visit will involve communicating with the Practitioner through live audiovisual communication technology and the disclosure of certain medical information by electronic transmission. I acknowledge that I have been given the opportunity to request an in-person assessment or other available alternative prior to the telemedicine visit and am voluntarily participating in the telemedicine visit.  I understand that I have the right to withhold or withdraw my consent to the use of telemedicine in the course of my care at any time, without affecting my right to future care or treatment, and that the Practitioner or I may terminate the telemedicine  visit at any time. I understand that I have the right to inspect all information obtained and/or recorded in the course of the telemedicine visit and may receive copies of available information for a reasonable fee.  I understand that some of the potential risks of receiving the Services via telemedicine include:   Delay or interruption in medical evaluation due to technological equipment failure or disruption;  Information transmitted may not be sufficient (e.g. poor resolution of images) to allow for appropriate medical decision making by the Practitioner; and/or   In rare instances, security protocols could fail, causing a breach of personal health information.  Furthermore, I acknowledge that it is my responsibility to provide information about my medical history, conditions and care that is complete and accurate to the best of my ability. I acknowledge that Practitioner's advice, recommendations, and/or decision may be based on factors not within their control, such as incomplete or inaccurate data provided by me or distortions of diagnostic images or specimens that may result from electronic transmissions. I understand that the practice of medicine is not an exact science  and that Practitioner makes no warranties or guarantees regarding treatment outcomes. I acknowledge that I will receive a copy of this consent concurrently upon execution via email to the email address I last provided but may also request a printed copy by calling the office of [facility].    I understand that my insurance will be billed for this visit.   I have read or had this consent read to me.  I understand the contents of this consent, which adequately explains the benefits and risks of the Services being provided via telemedicine.   I have been provided ample opportunity to ask questions regarding this consent and the Services and have had my questions answered to my satisfaction.  I give my informed consent for  the services to be provided through the use of telemedicine in my medical care  By participating in this telemedicine visit I agree to the above.

## 2018-12-25 NOTE — Telephone Encounter (Signed)
appt as scheduled

## 2019-01-02 ENCOUNTER — Encounter: Payer: Self-pay | Admitting: Cardiology

## 2019-01-02 ENCOUNTER — Other Ambulatory Visit: Payer: Self-pay

## 2019-01-02 ENCOUNTER — Telehealth (INDEPENDENT_AMBULATORY_CARE_PROVIDER_SITE_OTHER): Payer: PPO | Admitting: Cardiology

## 2019-01-02 VITALS — BP 145/78 | HR 72 | Ht 61.5 in | Wt 173.0 lb

## 2019-01-02 DIAGNOSIS — R079 Chest pain, unspecified: Secondary | ICD-10-CM

## 2019-01-02 DIAGNOSIS — M4802 Spinal stenosis, cervical region: Secondary | ICD-10-CM | POA: Diagnosis not present

## 2019-01-02 DIAGNOSIS — R002 Palpitations: Secondary | ICD-10-CM

## 2019-01-02 MED ORDER — METOPROLOL SUCCINATE ER 25 MG PO TB24: ORAL_TABLET | ORAL | 3 refills | Status: DC

## 2019-01-02 NOTE — Patient Instructions (Addendum)
Medication Instructions:  The current medical regimen is effective;  continue present plan and medications. I did refill your Metoprolol as requested.  If you need a refill on your cardiac medications before your next appointment, please call your pharmacy.   Follow-Up: At [facility], you and your health needs are our priority.  As part of our continuing mission to provide you with exceptional heart care, we have created designated Provider Care Teams.  These Care Teams include your primary Cardiologist (physician) and Advanced Practice Providers (APPs -  Physician Assistants and Nurse Practitioners) who all work together to provide you with the care you need, when you need it. You will need a follow up appointment in 12 months.  Please call our office 2 months in advance to schedule this appointment.  You may see Candee Furbish, MD or one of the following Advanced Practice Providers on your designated Care Team:   Truitt Merle, NP Cecilie Kicks, NP . Kathyrn Drown, NP  Thank you for choosing [facility]!!

## 2019-01-02 NOTE — Progress Notes (Signed)
Virtual Visit via Video Note   This visit type was conducted due to national recommendations for restrictions regarding the COVID-19 Pandemic (e.g. social distancing) in an effort to limit this patient's exposure and mitigate transmission in our community.  Due to her co-morbid illnesses, this patient is at least at moderate risk for complications without adequate follow up.  This format is felt to be most appropriate for this patient at this time.  All issues noted in this document were discussed and addressed.  A limited physical exam was performed with this format.  Please refer to the patient's chart for her consent to telehealth for [facility].   Date:  01/02/2019   ID:  CHENELL LOZON, DOB 12/19/1945, MRN [id number]  Patient Location: Home Provider Location: Home  PCP:  Shirline Frees, MD  Cardiologist:  Candee Furbish, MD  Electrophysiologist:  None   Evaluation Performed:  Follow-Up Visit  Chief Complaint: Palpitations follow-up  History of Present Illness:    Rhonda Reese is a 73 y.o. female with hemochromatosis and previous chest tightness here for follow-up.  Prior stress test 2011 was reassuring.  Holter monitor 2016 showed PACs as well as PAT but no atrial fibrillation.  Prior echo showed 70% EF with PA pressure 31 mmHg.  Involved heavily in the mission work.  She try and help people of [place].  The government is refusing to believe and COVID-19 she states.  People are dying on the streets.  Previously diagnosed with Epstein-Barr virus.  LDL 181.  Most recent nuclear stress test showed no ischemia.  Overall no chest pain fevers chills nausea vomiting syncope.  She did have a follow-up was later diagnosed with cervical spinal stenosis.  Knee pain.  The patient does not have symptoms concerning for COVID-19 infection (fever, chills, cough, or new shortness of breath).    Past Medical History:  Diagnosis Date  . Chronic kidney disease    kidney stones and  infection  . Diverticulosis   . Randell Patient infection   . GERD (gastroesophageal reflux disease)   . HA (headache)   . Hemochromatosis   . 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 Hyperlipidemia   . Memory loss   . Seasonal allergies   . Thyroid disease    reverse T3   Past Surgical History:  Procedure Laterality Date  . CATARACT EXTRACTION, BILATERAL    . CHOLECYSTECTOMY    . COLONOSCOPY    . GALLBLADDER SURGERY    . VAGINAL HYSTERECTOMY       Current Meds  Medication Sig  . ARMOUR THYROID 60 MG tablet Take 1 tablet by mouth in the morning  . Estradiol (VAGIFEM VA) Apply 1 Dose topically daily. Cream that pat rubs on arms  . famotidine (PEPCID) 40 MG tablet Take 40 mg by mouth daily.  . hydrochlorothiazide (HYDRODIURIL) 25 MG tablet Take 1 tablet (25 mg total) by mouth daily.  Marland Kitchen liothyronine (CYTOMEL) 5 MCG tablet Take 10 mcg by mouth daily.  . Melatonin 10 MG TABS Take 10 mg by mouth at bedtime as needed (sleep).  . metoprolol succinate (TOPROL-XL) 25 MG 24 hr tablet Take 1 tablet by mouth daily as needed for palpitations.  . Nattokinase 100 MG CAPS Take 100 mg by mouth daily.  . pantoprazole (PROTONIX) 40 MG tablet Take 40 mg by mouth daily.  . valACYclovir (VALTREX) 1000 MG tablet Take 1,000 mg by mouth as needed.   . [DISCONTINUED] metoprolol succinate (  TOPROL-XL) 25 MG 24 hr tablet Take 1 tablet by mouth daily as needed for palpitations. Please make overdue appt with Dr. Marlou Porch before anymore refills. 3rd attempt   Current Facility-Administered Medications for the 01/02/19 encounter (Telemedicine) with Jerline Pain, MD  Medication  . 0.9 %  sodium chloride infusion     Allergies:   Morphine and related and Other   Social History   Tobacco Use  . Smoking status: Never Smoker  . Smokeless tobacco: Never Used  Substance Use Topics  . Alcohol use: No  . Drug use: No     Family Hx: The patient's family history includes Diabetes in  her father; Heart failure in her father; Hemochromatosis in her mother; High Cholesterol in her father; High blood pressure in her father; Ovarian cancer in her mother; Thyroid disease in her mother. There is no history of Esophageal cancer, Prostate cancer, Rectal cancer, or Colon cancer.  ROS:   Please see the history of present illness.    No fever chills nausea vomiting syncope bleeding All other systems reviewed and are negative.   Prior CV studies:   The following studies were reviewed today:  Stress test normal 2018  Labs/Other Tests and Data Reviewed:    EKG:  An ECG dated 03/04/17 was personally reviewed today and demonstrated:  Sinus rhythm no other abnormalities  Recent Labs: No results found for requested labs within last 8760 hours.   Recent Lipid Panel No results found for: CHOL, TRIG, HDL, CHOLHDL, LDLCALC, LDLDIRECT  Wt Readings from Last 3 Encounters:  01/02/19 173 lb (78.5 kg)  10/21/17 193 lb (87.5 kg)  09/02/17 180 lb (81.6 kg)     Objective:    Vital Signs:  BP (!) 145/78   Pulse 72   Ht 5' 1.5" (1.562 m)   Wt 173 lb (78.5 kg)   BMI 32.16 kg/m    VITAL SIGNS:  reviewed GEN:  no acute distress EYES:  sclerae anicteric, EOMI - Extraocular Movements Intact RESPIRATORY:  normal respiratory effort, symmetric expansion SKIN:  no rash, lesions or ulcers. MUSCULOSKELETAL:  no obvious deformities. NEURO:  alert and oriented x 3, no obvious focal deficit PSYCH:  normal affect  ASSESSMENT & PLAN:    Prior chest pain - Occasional palpitations.  Nuclear stress test 2018 normal.  Reassurance.  Palpitations - Overall Toprol has improved.  Thyroid medication adjustment previously improved.  She is taking the metoprolol now on a daily basis.  This seems to help her out considerably.  Cervical spinal stenosis - Meloxicam is currently lysed.  Spoke about, warnings for NSAIDs.  Stay hydrated.  COVID-19 Education: The signs and symptoms of COVID-19 were  discussed with the patient and how to seek care for testing (follow up with PCP or arrange E-visit).  The importance of social distancing was discussed today.  Time:   Today, I have spent 15 minutes with the patient with telehealth technology discussing the above problems.     Medication Adjustments/Labs and Tests Ordered: Current medicines are reviewed at length with the patient today.  Concerns regarding medicines are outlined above.   Tests Ordered: No orders of the defined types were placed in this encounter.   Medication Changes: Meds ordered this encounter  Medications  . metoprolol succinate (TOPROL-XL) 25 MG 24 hr tablet    Sig: Take 1 tablet by mouth daily as needed for palpitations.    Dispense:  90 tablet    Refill:  3    Disposition:  Follow  up in 1 year(s)  Signed, Candee Furbish, MD  01/02/2019 3:16 PM    [place]

## 2019-01-03 DIAGNOSIS — E039 Hypothyroidism, unspecified: Secondary | ICD-10-CM | POA: Diagnosis not present

## 2019-01-03 DIAGNOSIS — K588 Other irritable bowel syndrome: Secondary | ICD-10-CM | POA: Diagnosis not present

## 2019-01-03 DIAGNOSIS — R609 Edema, unspecified: Secondary | ICD-10-CM | POA: Diagnosis not present

## 2019-01-03 DIAGNOSIS — B2790 Infectious mononucleosis, unspecified without complication: Secondary | ICD-10-CM | POA: Diagnosis not present

## 2019-01-03 DIAGNOSIS — R002 Palpitations: Secondary | ICD-10-CM | POA: Diagnosis not present

## 2019-01-03 DIAGNOSIS — K219 Gastro-esophageal reflux disease without esophagitis: Secondary | ICD-10-CM | POA: Diagnosis not present

## 2019-01-04 DIAGNOSIS — H40053 Ocular hypertension, bilateral: Secondary | ICD-10-CM | POA: Diagnosis not present

## 2019-01-04 DIAGNOSIS — Z83511 Family history of glaucoma: Secondary | ICD-10-CM | POA: Diagnosis not present

## 2019-01-10 DIAGNOSIS — K219 Gastro-esophageal reflux disease without esophagitis: Secondary | ICD-10-CM | POA: Diagnosis not present

## 2019-01-10 DIAGNOSIS — R1310 Dysphagia, unspecified: Secondary | ICD-10-CM | POA: Diagnosis not present

## 2019-01-10 DIAGNOSIS — Z8719 Personal history of other diseases of the digestive system: Secondary | ICD-10-CM | POA: Diagnosis not present

## 2019-02-27 DIAGNOSIS — E538 Deficiency of other specified B group vitamins: Secondary | ICD-10-CM | POA: Diagnosis not present

## 2019-02-27 DIAGNOSIS — K210 Gastro-esophageal reflux disease with esophagitis: Secondary | ICD-10-CM | POA: Diagnosis not present

## 2019-02-27 DIAGNOSIS — R600 Localized edema: Secondary | ICD-10-CM | POA: Diagnosis not present

## 2019-02-27 DIAGNOSIS — R002 Palpitations: Secondary | ICD-10-CM | POA: Diagnosis not present

## 2019-02-27 DIAGNOSIS — K588 Other irritable bowel syndrome: Secondary | ICD-10-CM | POA: Diagnosis not present

## 2019-02-27 DIAGNOSIS — E7800 Pure hypercholesterolemia, unspecified: Secondary | ICD-10-CM | POA: Diagnosis not present

## 2019-02-27 DIAGNOSIS — E039 Hypothyroidism, unspecified: Secondary | ICD-10-CM | POA: Diagnosis not present

## 2019-03-16 DIAGNOSIS — Z1159 Encounter for screening for other viral diseases: Secondary | ICD-10-CM | POA: Diagnosis not present

## 2019-03-21 DIAGNOSIS — K3189 Other diseases of stomach and duodenum: Secondary | ICD-10-CM | POA: Diagnosis not present

## 2019-03-21 DIAGNOSIS — R1310 Dysphagia, unspecified: Secondary | ICD-10-CM | POA: Diagnosis not present

## 2019-03-21 DIAGNOSIS — K2930 Chronic superficial gastritis without bleeding: Secondary | ICD-10-CM | POA: Diagnosis not present

## 2019-03-21 DIAGNOSIS — K210 Gastro-esophageal reflux disease with esophagitis: Secondary | ICD-10-CM | POA: Diagnosis not present

## 2019-03-23 DIAGNOSIS — K2930 Chronic superficial gastritis without bleeding: Secondary | ICD-10-CM | POA: Diagnosis not present

## 2019-03-23 DIAGNOSIS — K3189 Other diseases of stomach and duodenum: Secondary | ICD-10-CM | POA: Diagnosis not present

## 2019-03-23 DIAGNOSIS — K210 Gastro-esophageal reflux disease with esophagitis: Secondary | ICD-10-CM | POA: Diagnosis not present

## 2019-03-23 DIAGNOSIS — R1310 Dysphagia, unspecified: Secondary | ICD-10-CM | POA: Diagnosis not present

## 2019-04-03 ENCOUNTER — Other Ambulatory Visit: Payer: Self-pay | Admitting: Gastroenterology

## 2019-04-03 DIAGNOSIS — R1013 Epigastric pain: Secondary | ICD-10-CM

## 2019-04-06 ENCOUNTER — Ambulatory Visit
Admission: RE | Admit: 2019-04-06 | Discharge: 2019-04-06 | Disposition: A | Discharge status: Home / Self Care | Payer: PPO | Source: Ambulatory Visit | Attending: Gastroenterology | Admitting: Gastroenterology

## 2019-04-06 DIAGNOSIS — K838 Other specified diseases of biliary tract: Secondary | ICD-10-CM | POA: Diagnosis not present

## 2019-04-06 DIAGNOSIS — R1013 Epigastric pain: Secondary | ICD-10-CM

## 2019-04-06 DIAGNOSIS — K30 Functional dyspepsia: Secondary | ICD-10-CM | POA: Diagnosis not present

## 2019-04-06 DIAGNOSIS — K7689 Other specified diseases of liver: Secondary | ICD-10-CM | POA: Diagnosis not present

## 2019-04-23 DIAGNOSIS — K2950 Unspecified chronic gastritis without bleeding: Secondary | ICD-10-CM | POA: Diagnosis not present

## 2019-04-23 DIAGNOSIS — R932 Abnormal findings on diagnostic imaging of liver and biliary tract: Secondary | ICD-10-CM | POA: Diagnosis not present

## 2019-04-23 DIAGNOSIS — Z86010 Personal history of colonic polyps: Secondary | ICD-10-CM | POA: Diagnosis not present

## 2019-04-30 DIAGNOSIS — Z1389 Encounter for screening for other disorder: Secondary | ICD-10-CM | POA: Diagnosis not present

## 2019-04-30 DIAGNOSIS — Z842 Family history of other diseases of the genitourinary system: Secondary | ICD-10-CM | POA: Diagnosis not present

## 2019-04-30 DIAGNOSIS — Z130 Encounter for screening for diseases of the blood and blood-forming organs and certain disorders involving the immune mechanism: Secondary | ICD-10-CM | POA: Diagnosis not present

## 2019-04-30 DIAGNOSIS — Z1231 Encounter for screening mammogram for malignant neoplasm of breast: Secondary | ICD-10-CM | POA: Diagnosis not present

## 2019-04-30 DIAGNOSIS — K2950 Unspecified chronic gastritis without bleeding: Secondary | ICD-10-CM | POA: Diagnosis not present

## 2019-04-30 DIAGNOSIS — Z139 Encounter for screening, unspecified: Secondary | ICD-10-CM | POA: Diagnosis not present

## 2019-04-30 DIAGNOSIS — Z6830 Body mass index (BMI) 30.0-30.9, adult: Secondary | ICD-10-CM | POA: Diagnosis not present

## 2019-04-30 DIAGNOSIS — Z01419 Encounter for gynecological examination (general) (routine) without abnormal findings: Secondary | ICD-10-CM | POA: Diagnosis not present

## 2019-06-14 DIAGNOSIS — H40053 Ocular hypertension, bilateral: Secondary | ICD-10-CM | POA: Diagnosis not present

## 2019-06-14 DIAGNOSIS — Z83511 Family history of glaucoma: Secondary | ICD-10-CM | POA: Diagnosis not present

## 2019-07-06 DIAGNOSIS — Z20828 Contact with and (suspected) exposure to other viral communicable diseases: Secondary | ICD-10-CM | POA: Diagnosis not present

## 2019-07-06 DIAGNOSIS — U071 COVID-19: Secondary | ICD-10-CM | POA: Diagnosis not present

## 2019-08-23 DIAGNOSIS — M1712 Unilateral primary osteoarthritis, left knee: Secondary | ICD-10-CM | POA: Diagnosis not present

## 2019-10-22 DIAGNOSIS — M5412 Radiculopathy, cervical region: Secondary | ICD-10-CM | POA: Diagnosis not present

## 2019-10-22 DIAGNOSIS — M4692 Unspecified inflammatory spondylopathy, cervical region: Secondary | ICD-10-CM | POA: Diagnosis not present

## 2019-10-22 DIAGNOSIS — M542 Cervicalgia: Secondary | ICD-10-CM | POA: Diagnosis not present

## 2019-10-23 ENCOUNTER — Telehealth: Payer: Self-pay | Admitting: Hematology

## 2019-10-23 DIAGNOSIS — M4722 Other spondylosis with radiculopathy, cervical region: Secondary | ICD-10-CM | POA: Diagnosis not present

## 2019-10-23 DIAGNOSIS — M6281 Muscle weakness (generalized): Secondary | ICD-10-CM | POA: Diagnosis not present

## 2019-10-23 DIAGNOSIS — M542 Cervicalgia: Secondary | ICD-10-CM | POA: Diagnosis not present

## 2019-10-23 DIAGNOSIS — M5412 Radiculopathy, cervical region: Secondary | ICD-10-CM | POA: Diagnosis not present

## 2019-10-23 NOTE — Telephone Encounter (Signed)
Received a new pt referral from Dr. Kenton Kingfisher, [facility], for hereditary hemochromatosis. Ms. Angerman has been cld and scheduled to see Dr. Irene Limbo on 4/5 at 1pm. She's been made aware to arrive 15 minutes early.

## 2019-10-24 DIAGNOSIS — M4722 Other spondylosis with radiculopathy, cervical region: Secondary | ICD-10-CM | POA: Diagnosis not present

## 2019-10-24 DIAGNOSIS — M6281 Muscle weakness (generalized): Secondary | ICD-10-CM | POA: Diagnosis not present

## 2019-10-24 DIAGNOSIS — M542 Cervicalgia: Secondary | ICD-10-CM | POA: Diagnosis not present

## 2019-11-02 DIAGNOSIS — M542 Cervicalgia: Secondary | ICD-10-CM | POA: Diagnosis not present

## 2019-11-02 DIAGNOSIS — M4722 Other spondylosis with radiculopathy, cervical region: Secondary | ICD-10-CM | POA: Diagnosis not present

## 2019-11-02 DIAGNOSIS — M6281 Muscle weakness (generalized): Secondary | ICD-10-CM | POA: Diagnosis not present

## 2019-11-05 DIAGNOSIS — M6281 Muscle weakness (generalized): Secondary | ICD-10-CM | POA: Diagnosis not present

## 2019-11-05 DIAGNOSIS — M542 Cervicalgia: Secondary | ICD-10-CM | POA: Diagnosis not present

## 2019-11-05 DIAGNOSIS — M4722 Other spondylosis with radiculopathy, cervical region: Secondary | ICD-10-CM | POA: Diagnosis not present

## 2019-11-07 DIAGNOSIS — E039 Hypothyroidism, unspecified: Secondary | ICD-10-CM | POA: Diagnosis not present

## 2019-11-07 DIAGNOSIS — M25662 Stiffness of left knee, not elsewhere classified: Secondary | ICD-10-CM | POA: Diagnosis not present

## 2019-11-07 DIAGNOSIS — M542 Cervicalgia: Secondary | ICD-10-CM | POA: Diagnosis not present

## 2019-11-07 DIAGNOSIS — M6281 Muscle weakness (generalized): Secondary | ICD-10-CM | POA: Diagnosis not present

## 2019-11-07 DIAGNOSIS — E7800 Pure hypercholesterolemia, unspecified: Secondary | ICD-10-CM | POA: Diagnosis not present

## 2019-11-12 ENCOUNTER — Other Ambulatory Visit: Payer: Self-pay

## 2019-11-12 ENCOUNTER — Inpatient Hospital Stay: Payer: PPO | Attending: Hematology | Admitting: Hematology

## 2019-11-12 ENCOUNTER — Inpatient Hospital Stay: Payer: PPO

## 2019-11-12 DIAGNOSIS — M6281 Muscle weakness (generalized): Secondary | ICD-10-CM | POA: Diagnosis not present

## 2019-11-12 DIAGNOSIS — R5383 Other fatigue: Secondary | ICD-10-CM | POA: Insufficient documentation

## 2019-11-12 DIAGNOSIS — K219 Gastro-esophageal reflux disease without esophagitis: Secondary | ICD-10-CM | POA: Insufficient documentation

## 2019-11-12 DIAGNOSIS — M4722 Other spondylosis with radiculopathy, cervical region: Secondary | ICD-10-CM | POA: Diagnosis not present

## 2019-11-12 DIAGNOSIS — E785 Hyperlipidemia, unspecified: Secondary | ICD-10-CM | POA: Insufficient documentation

## 2019-11-12 DIAGNOSIS — N189 Chronic kidney disease, unspecified: Secondary | ICD-10-CM | POA: Diagnosis not present

## 2019-11-12 DIAGNOSIS — M48061 Spinal stenosis, lumbar region without neurogenic claudication: Secondary | ICD-10-CM | POA: Diagnosis not present

## 2019-11-12 DIAGNOSIS — M542 Cervicalgia: Secondary | ICD-10-CM | POA: Diagnosis not present

## 2019-11-12 DIAGNOSIS — M4802 Spinal stenosis, cervical region: Secondary | ICD-10-CM | POA: Diagnosis not present

## 2019-11-12 DIAGNOSIS — Z79899 Other long term (current) drug therapy: Secondary | ICD-10-CM | POA: Insufficient documentation

## 2019-11-12 LAB — CBC WITH DIFFERENTIAL/PLATELET
Abs Immature Granulocytes: 0.02 10*3/uL (ref 0.00–0.07)
Basophils Absolute: 0 10*3/uL (ref 0.0–0.1)
Basophils Relative: 1 %
Eosinophils Absolute: 0.1 10*3/uL (ref 0.0–0.5)
Eosinophils Relative: 1 %
HCT: 47 % — ABNORMAL HIGH (ref 36.0–46.0)
Hemoglobin: 15.4 g/dL — ABNORMAL HIGH (ref 12.0–15.0)
Immature Granulocytes: 0 %
Lymphocytes Relative: 35 %
Lymphs Abs: 2.8 10*3/uL (ref 0.7–4.0)
MCH: 29.9 pg (ref 26.0–34.0)
MCHC: 32.8 g/dL (ref 30.0–36.0)
MCV: 91.3 fL (ref 80.0–100.0)
Monocytes Absolute: 0.4 10*3/uL (ref 0.1–1.0)
Monocytes Relative: 6 %
Neutro Abs: 4.5 10*3/uL (ref 1.7–7.7)
Neutrophils Relative %: 57 %
Platelets: 289 10*3/uL (ref 150–400)
RBC: 5.15 MIL/uL — ABNORMAL HIGH (ref 3.87–5.11)
RDW: 12.1 % (ref 11.5–15.5)
WBC: 7.8 10*3/uL (ref 4.0–10.5)
nRBC: 0 % (ref 0.0–0.2)

## 2019-11-12 LAB — FERRITIN: Ferritin: 132 ng/mL (ref 11–307)

## 2019-11-12 LAB — CMP (CANCER CENTER ONLY)
ALT: 20 U/L (ref 0–44)
AST: 17 U/L (ref 15–41)
Albumin: 4.1 g/dL (ref 3.5–5.0)
Alkaline Phosphatase: 101 U/L (ref 38–126)
Anion gap: 10 (ref 5–15)
BUN: 13 mg/dL (ref 8–23)
CO2: 30 mmol/L (ref 22–32)
Calcium: 9.8 mg/dL (ref 8.9–10.3)
Chloride: 104 mmol/L (ref 98–111)
Creatinine: 0.83 mg/dL (ref 0.44–1.00)
GFR, Est AFR Am: >60 mL/min (ref >60)
GFR, Estimated: >60 mL/min (ref >60)
Glucose, Bld: 89 mg/dL (ref 70–99)
Potassium: 4.3 mmol/L (ref 3.5–5.1)
Sodium: 144 mmol/L (ref 135–145)
Total Bilirubin: 0.4 mg/dL (ref 0.3–1.2)
Total Protein: 7.2 g/dL (ref 6.5–8.1)

## 2019-11-12 LAB — IRON AND TIBC
Iron: 89 ug/dL (ref 41–142)
Saturation Ratios: 29 % (ref 21–57)
TIBC: 301 ug/dL (ref 236–444)
UIBC: 212 ug/dL (ref 120–384)

## 2019-11-12 NOTE — Progress Notes (Signed)
HEMATOLOGY/ONCOLOGY CONSULTATION NOTE  Date of Service: 11/12/2019  Patient Care Team: Shirline Frees, MD as PCP - General (Family Medicine) Jerline Pain, MD as PCP - Cardiology (Cardiology) Carroll Kinds, NP (Inactive) as Nurse Practitioner (Nurse Practitioner)  CHIEF COMPLAINTS/PURPOSE OF CONSULTATION:  Hereditary Hemochromatosis  HISTORY OF PRESENTING ILLNESS:   Rhonda Reese is a wonderful 74 y.o. female who has been referred to [place] by Dr Kenton Kingfisher for evaluation and management of hereditary hemochromatosis. The pt reports that she is doing well overall.    The pt reports that about 10 years ago she was sleeping 18 hours per day due to lack of energy. In March of 2013 they began testing to find the cause of her fatigue and found that she had hemachromatosis. Her Hgb was 17.5 and her Ferritin was in the 400's. To correct her elevated Hgb and Ferritin levels they began to do therapeutic phlebotomies. Pt received one phlebotomy per week for three weeks and then one every four months. By April 2014 her Ferritin was just over 100. Her last phlebotomy was near the end of 2017. 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   Pt does not drink any alcohol, smoke cigarettes, does not eat much red meat and eats a balanced diet. Pt has previously used acid suppressant medication long-term. Her grandfather passed from a blood clot in his brain. Pt has varicose veins, but denies any prior blood clots.  She has been experiencing some pain due to her varicose veins.  She is currently going through a period of increased fatigue.    Most recent lab results (02/27/2019) of CBC w/diff and CMP is as follows: all values are WNL except for Lymphs Abs at 2.90K, Glucose at 156. 02/27/2019 Ferritin at 97.6 02/27/2019 Vitamin B12 at 1420 04/30/2019 Hepatic Panel shows all values are WNL.  10/21/2017 Hemochromatosis mutation study  shows "Heterozygous for the C282Y Mutation".  On review of systems, pt reports joint pain, fatigue and denies unexpected weight loss, abdominal pain and any other symptoms.   On PMHx the pt reports CKD, GERD, Thyroid disease, Cervical spinal stenosis, Lumbar spinal stenosis, Cholecystectomy. On Social Hx the pt reports that she is a non-smoker and does not drink alcohol.    MEDICAL HISTORY:  Past Medical History:  Diagnosis Date  . Chronic kidney disease    kidney stones and infection  . Diverticulosis   . Randell Patient infection   . GERD (gastroesophageal reflux disease)   . HA (headache)   . Hemochromatosis   . 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 Hyperlipidemia   . Memory loss   . Seasonal allergies   . Thyroid disease    reverse T3    SURGICAL HISTORY: Past Surgical History:  Procedure Laterality Date  . CATARACT EXTRACTION, BILATERAL    . CHOLECYSTECTOMY    . COLONOSCOPY    . GALLBLADDER SURGERY    . VAGINAL HYSTERECTOMY      SOCIAL HISTORY: Social History   Socioeconomic History  . Marital status: Married    Spouse name: Gershon Mussel  . Number of children: 3  . Years of education: 5  . Highest education level: Not on file  Occupational History    Comment: [employer]  Tobacco Use  . Smoking status: Never Smoker  . Smokeless tobacco: Never Used  Substance and Sexual Activity  . Alcohol use: No  .  Drug use: No  . Sexual activity: Not on file  Other Topics Concern  . Not on file  Social History Narrative   Patient lives at home with her husband Gershon Mussel). Patient is in ministry. Patient has high school education.   Right handed.   Caffeine-two cups daily    Social Determinants of Health   Financial Resource Strain:   . Difficulty of Paying Living Expenses:   Food Insecurity:   . Worried About Charity fundraiser in the Last Year:   . Arboriculturist in the Last Year:   Transportation Needs:   . Film/video editor (Medical):     Marland Kitchen Lack of Transportation (Non-Medical):   Physical Activity:   . Days of Exercise per Week:   . Minutes of Exercise per Session:   Stress:   . Feeling of Stress :   Social Connections:   . Frequency of Communication with Friends and Family:   . Frequency of Social Gatherings with Friends and Family:   . Attends Religious Services:   . Active Member of Clubs or Organizations:   . Attends Archivist Meetings:   Marland Kitchen Marital Status:   Intimate Partner Violence:   . Fear of Current or Ex-Partner:   . Emotionally Abused:   Marland Kitchen Physically Abused:   . Sexually Abused:     FAMILY HISTORY: Family History  Problem Relation Age of Onset  . Thyroid disease Mother   . Hemochromatosis Mother   . Ovarian cancer Mother   . High blood pressure Father   . High Cholesterol Father   . Diabetes Father   . Heart failure Father   . Esophageal cancer Neg Hx   . Prostate cancer Neg Hx   . Rectal cancer Neg Hx   . Colon cancer Neg Hx     ALLERGIES:  is allergic to morphine and related and other.  MEDICATIONS:  Current Outpatient Medications  Medication Sig Dispense Refill  . ARMOUR THYROID 60 MG tablet Take 1 tablet by mouth in the morning  5  . Estradiol (VAGIFEM VA) Apply 1 Dose topically daily. Cream that pat rubs on arms    . famotidine (PEPCID) 40 MG tablet Take 40 mg by mouth daily.    . hydrochlorothiazide (HYDRODIURIL) 25 MG tablet Take 1 tablet (25 mg total) by mouth daily. 90 tablet 3  . liothyronine (CYTOMEL) 5 MCG tablet Take 10 mcg by mouth daily.    . Melatonin 10 MG TABS Take 10 mg by mouth at bedtime as needed (sleep).    . metoprolol succinate (TOPROL-XL) 25 MG 24 hr tablet Take 1 tablet by mouth daily as needed for palpitations. 90 tablet 3  . Nattokinase 100 MG CAPS Take 100 mg by mouth daily.    . pantoprazole (PROTONIX) 40 MG tablet Take 40 mg by mouth daily.    . valACYclovir (VALTREX) 1000 MG tablet Take 1,000 mg by mouth as needed.      Current  Facility-Administered Medications  Medication Dose Route Frequency Provider Last Rate Last Admin  . 0.9 %  sodium chloride infusion  500 mL Intravenous Once Armbruster, Carlota Raspberry, MD        REVIEW OF SYSTEMS:    10 Point review of Systems was done is negative except as noted above.  PHYSICAL EXAMINATION: ECOG PERFORMANCE STATUS: 0 - Asymptomatic  . Vitals:   11/12/19 1330  BP: (!) 139/118  Pulse: 94  Resp: 18  Temp: 98.5 F (36.9 C)  SpO2: 98%   Filed Weights   11/12/19 1330  Weight: 189 lb 8 oz (86 kg)   .Body mass index is 35.23 kg/m.   GENERAL:alert, in no acute distress and comfortable SKIN: no acute rashes, no significant lesions EYES: conjunctiva are pink and non-injected, sclera anicteric OROPHARYNX: MMM, no exudates, no oropharyngeal erythema or ulceration NECK: supple, no JVD LYMPH:  no palpable lymphadenopathy in the cervical, axillary or inguinal regions LUNGS: clear to auscultation b/l with normal respiratory effort HEART: regular rate & rhythm ABDOMEN:  normoactive bowel sounds , non tender, not distended. Extremity: no pedal edema PSYCH: alert & oriented x 3 with fluent speech NEURO: no focal motor/sensory deficits  LABORATORY DATA:  I have reviewed the data as listed  . CBC Latest Ref Rng & Units 11/12/2019 04/25/2015 05/02/2014  WBC 4.0 - 10.5 K/uL 7.8 7.7 8.1  Hemoglobin 12.0 - 15.0 g/dL 15.4(H) 15.4(H) 15.8  Hematocrit 36.0 - 46.0 % 47.0(H) 45.9 46.4  Platelets 150 - 400 K/uL 289 303 334    . CMP Latest Ref Rng & Units 11/12/2019 04/25/2015 09/02/2014  Glucose 70 - 99 mg/dL 89 94 103(H)  BUN 8 - 23 mg/dL 13 12 12   Creatinine 0.44 - 1.00 mg/dL 0.83 0.70 0.73  Sodium 135 - 145 mmol/L 144 142 135  Potassium 3.5 - 5.1 mmol/L 4.3 4.0 3.5  Chloride 98 - 111 mmol/L 104 103 104  CO2 22 - 32 mmol/L 30 31 29   Calcium 8.9 - 10.3 mg/dL 9.8 9.8 8.5  Total Protein 6.5 - 8.1 g/dL 7.2 7.6 -  Total Bilirubin 0.3 - 1.2 mg/dL 0.4 0.7 -  Alkaline Phos 38 - 126  U/L 101 74 -  AST 15 - 41 U/L 17 22 -  ALT 0 - 44 U/L 20 26 -   10/21/2017 Hemochromatosis study:    . Lab Results  Component Value Date   IRON 89 11/12/2019   TIBC 301 11/12/2019   IRONPCTSAT 29 11/12/2019   (Iron and TIBC)  Lab Results  Component Value Date   FERRITIN 132 11/12/2019    RADIOGRAPHIC STUDIES: I have personally reviewed the radiological images as listed and agreed with the findings in the report. No results found.  ASSESSMENT & PLAN:   74 yo with   1) Hereditary Hemochromatosis carrier status (heterozygous for C282Y mutation) PLAN: -Discussed patient's most recent labs from 02/27/2019, all values are WNL except for Lymphs Abs at 2.90K, Glucose at 156. -Discussed 02/27/2019 Ferritin at 97.6 -Discussed 02/27/2019 Vitamin B12 at 1420 -Discussed 04/30/2019 Hepatic Panel shows all values are WNL.  -Discussed 10/21/2017 Hemochromatosis mutation study shows "Heterozygous for the C282Y Mutation". -Advised pt that her heterozygous mutation does not carry the highest risk, but still increases the amount of iron absorbed. -Discussed that pt that she may want to speak to her children about her carrier state, as they have a 50% change of inheriting the gene. -Advised pt that organ threatening injuries, especially in the liver and heart, occur when Ferritin >1000.  -Advised pt that her previous menstrual cycles helped keep her Ferritin levels down.  -Advised pt that alcohol, Vitamin C, cooking in iron skillets, and red-meat can affect how many phlebotomies are needed -Goal Ferritin is <200 - could keep a little higher, due to CKD  -Advised pt that vitamin replacement can help prevent anemia from phlebotomies  -Advised pt that her previously elevated Hgb was not due to PCV, as it has normalized on its own. Her previous polycythemia was likely  secondary to process that limited oxygen in the body. -Recommend pt use sports-grade compression socks and elevate legs to improve  varicose veins. -Not unreasonable to continue Vitamin D and B-complex to reduce homocysteine levels.  -Recommend pt receive the COVID19 vaccine when available  -Will get labs today  -Recommend pt continue to follow with Dr. Kenton Kingfisher  -Will see back as needed if ferritin >200 for therapeutic phlebotomy as needed   FOLLOW UP: Labs today RTC with Dr Irene Limbo as needed  All of the patients questions were answered with apparent satisfaction. The patient knows to call the clinic with any problems, questions or concerns.  I spent 30 mins counseling the patient face to face. The total time spent in the appointment was 45 minutes and more than 50% was on counseling and direct patient cares.    Sullivan Lone MD [place] AAHIVMS [facility] Hematology/Oncology Physician [facility]  (Office):       [phone] (Work cell):  [phone] (Fax):           [phone]  11/12/2019 8:01 AM  I, Yevette Edwards, am acting as a scribe for Dr. Sullivan Lone.   .I have reviewed the above documentation for accuracy and completeness, and I agree with the above. Brunetta Genera MD

## 2019-12-03 DIAGNOSIS — M542 Cervicalgia: Secondary | ICD-10-CM | POA: Diagnosis not present

## 2019-12-03 DIAGNOSIS — M4696 Unspecified inflammatory spondylopathy, lumbar region: Secondary | ICD-10-CM | POA: Diagnosis not present

## 2019-12-03 DIAGNOSIS — M4722 Other spondylosis with radiculopathy, cervical region: Secondary | ICD-10-CM | POA: Diagnosis not present

## 2019-12-03 DIAGNOSIS — M6281 Muscle weakness (generalized): Secondary | ICD-10-CM | POA: Diagnosis not present

## 2019-12-05 DIAGNOSIS — E7800 Pure hypercholesterolemia, unspecified: Secondary | ICD-10-CM | POA: Diagnosis not present

## 2019-12-05 DIAGNOSIS — E039 Hypothyroidism, unspecified: Secondary | ICD-10-CM | POA: Diagnosis not present

## 2019-12-12 DIAGNOSIS — Z20822 Contact with and (suspected) exposure to covid-19: Secondary | ICD-10-CM | POA: Diagnosis not present

## 2019-12-15 DIAGNOSIS — R05 Cough: Secondary | ICD-10-CM | POA: Diagnosis not present

## 2019-12-15 DIAGNOSIS — Z1152 Encounter for screening for COVID-19: Secondary | ICD-10-CM | POA: Diagnosis not present

## 2019-12-15 DIAGNOSIS — R509 Fever, unspecified: Secondary | ICD-10-CM | POA: Diagnosis not present

## 2020-01-03 ENCOUNTER — Encounter: Payer: Self-pay | Admitting: Cardiology

## 2020-01-03 ENCOUNTER — Other Ambulatory Visit: Payer: Self-pay

## 2020-01-03 ENCOUNTER — Ambulatory Visit: Payer: PPO | Admitting: Cardiology

## 2020-01-03 VITALS — BP 130/84 | HR 86 | Ht 61.5 in | Wt 189.2 lb

## 2020-01-03 DIAGNOSIS — R079 Chest pain, unspecified: Secondary | ICD-10-CM | POA: Diagnosis not present

## 2020-01-03 DIAGNOSIS — R0602 Shortness of breath: Secondary | ICD-10-CM

## 2020-01-03 NOTE — Progress Notes (Signed)
Cardiology Office Note:    Date:  01/03/2020   ID:  Rhonda Reese, DOB [birth year], MRN [id number]  PCP:  Shirline Frees, MD  Cardiologist:  Candee Furbish, MD  Electrophysiologist:  None   Referring MD: Shirline Frees, MD     History of Present Illness:    Rhonda Reese is a 74 y.o. female here for the follow-up of prior chest tightness.  Has hemochromatosis.  Nuclear stress test 2011, 2018 was reassuring Holter monitor 2016 showed PACs as well as PAT but no atrial fibrillation Echocardiogram showed 70% EF PA pressures 31  Enjoys mission work, [place].  Previously diagnosed with Epstein-Barr.  LDL in the past has been 181.  Hard to exercise with spinal stenosis.    Past Medical History:  Diagnosis Date  . Chronic kidney disease    kidney stones and infection  . Diverticulosis   . Randell Patient infection   . GERD (gastroesophageal reflux disease)   . HA (headache)   . Hemochromatosis   . 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 Hyperlipidemia   . Memory loss   . Seasonal allergies   . Thyroid disease    reverse T3    Past Surgical History:  Procedure Laterality Date  . CATARACT EXTRACTION, BILATERAL    . CHOLECYSTECTOMY    . COLONOSCOPY    . GALLBLADDER SURGERY    . VAGINAL HYSTERECTOMY      Current Medications: Current Meds  Medication Sig  . ARMOUR THYROID 60 MG tablet Take 1 tablet by mouth in the morning  . B Complex Vitamins (VITAMIN B COMPLEX PO) Take by mouth. Takes combination of B1, B12, B6 & Methylfolate  . cyclobenzaprine (FLEXERIL) 5 MG tablet Take 5 mg by mouth at bedtime as needed for muscle spasms.  . Estradiol (VAGIFEM VA) Apply 1 Dose topically daily. Cream that pat rubs on arms  . famotidine (PEPCID) 40 MG tablet Take 40 mg by mouth daily.  . hydrochlorothiazide (HYDRODIURIL) 12.5 MG tablet Take 12.5 mg by mouth daily.  Marland Kitchen liothyronine (CYTOMEL) 5 MCG tablet Take 10 mcg by mouth daily.  Marland Kitchen loratadine  (CLARITIN) 10 MG tablet Take 10 mg by mouth daily.  . magnesium oxide (MAG-OX) 400 MG tablet Take 400 mg by mouth daily.  . metoprolol succinate (TOPROL-XL) 25 MG 24 hr tablet Take 1 tablet by mouth daily as needed for palpitations.  . Nattokinase 100 MG CAPS Take 100 mg by mouth daily.  Marland Kitchen ROBAXIN-750 750 MG tablet Take 750 mg by mouth every 6 (six) hours as needed.  . valACYclovir (VALTREX) 1000 MG tablet Take 1,000 mg by mouth as needed.   Marland Kitchen VITAMIN D PO Take 1 tablet by mouth daily. 10,000 mg  . Zinc 50 MG TABS Take by mouth.   Current Facility-Administered Medications for the 01/03/20 encounter (Office Visit) with Jerline Pain, MD  Medication  . 0.9 %  sodium chloride infusion     Allergies:   Chloroquine phosphate, Morphine and related, and Prednisone   Social History   Socioeconomic History  . Marital status: Married    Spouse name: Gershon Mussel  . Number of children: 3  . Years of education: 7  . Highest education level: Not on file  Occupational History    Comment: [employer]  Tobacco Use  . Smoking status: Never Smoker  . Smokeless tobacco: Never Used  Substance and Sexual Activity  . Alcohol use: No  . Drug use: No  .  Sexual activity: Not on file  Other Topics Concern  . Not on file  Social History Narrative   Patient lives at home with her husband Gershon Mussel). Patient is in ministry. Patient has high school education.   Right handed.   Caffeine-two cups daily    Social Determinants of Health   Financial Resource Strain:   . Difficulty of Paying Living Expenses:   Food Insecurity:   . Worried About Charity fundraiser in the Last Year:   . Arboriculturist in the Last Year:   Transportation Needs:   . Film/video editor (Medical):   Marland Kitchen Lack of Transportation (Non-Medical):   Physical Activity:   . Days of Exercise per Week:   . Minutes of Exercise per Session:   Stress:   . Feeling of Stress :   Social Connections:   . Frequency of Communication with Friends and  Family:   . Frequency of Social Gatherings with Friends and Family:   . Attends Religious Services:   . Active Member of Clubs or Organizations:   . Attends Archivist Meetings:   Marland Kitchen Marital Status:      Family History: The patient's family history includes Diabetes in her father; Heart failure in her father; Hemochromatosis in her mother; High Cholesterol in her father; High blood pressure in her father; Ovarian cancer in her mother; Thyroid disease in her mother. There is no history of Esophageal cancer, Prostate cancer, Rectal cancer, or Colon cancer.  ROS:   Please see the history of present illness.     All other systems reviewed and are negative.  EKGs/Labs/Other Studies Reviewed:    The following studies were reviewed today: As above  ECHO 2016  - Left ventricle: The cavity size was normal. Systolic function was  vigorous. The estimated ejection fraction was in the range of 65%  to 70%. Wall motion was normal; there were no regional wall  motion abnormalities. Doppler parameters are consistent with  abnormal left ventricular relaxation (grade 1 diastolic  dysfunction). There was no evidence of elevated ventricular  filling pressure by Doppler parameters.  - Aortic valve: Trileaflet; normal thickness leaflets. There was no  regurgitation.  - Aortic root: The aortic root was normal in size.  - Mitral valve: Structurally normal valve. There was no  regurgitation.  - Left atrium: The atrium was at the upper limits of normal in  size.  - Right ventricle: Systolic function was normal.  - Right atrium: The atrium was normal in size.  - Tricuspid valve: There was mild regurgitation.  - Pulmonic valve: Transvalvular velocity was within the normal  range. There was no evidence for stenosis.  - Pulmonary arteries: Systolic pressure was within the normal  range. PA peak pressure: 31 mm Hg (S).  - Inferior vena cava: The vessel was normal in size.  The  respirophasic diameter changes were in the normal range (= 50%),  consistent with normal central venous pressure.  - Pericardium, extracardiac: There was no pericardial effusion.   EKG:  EKG is  ordered today.  The ekg ordered today demonstrates sinus rhythm 86 no other abnormalities.  Recent Labs: 11/12/2019: ALT 20; BUN 13; Creatinine 0.83; Hemoglobin 15.4; Platelets 289; Potassium 4.3; Sodium 144  Recent Lipid Panel No results found for: CHOL, TRIG, HDL, CHOLHDL, VLDL, LDLCALC, LDLDIRECT  Physical Exam:    VS:  BP 130/84   Pulse 86   Ht 5' 1.5" (1.562 m)   Wt 189 lb 3.2 oz (  85.8 kg)   SpO2 97%   BMI 35.17 kg/m     Wt Readings from Last 3 Encounters:  01/03/20 189 lb 3.2 oz (85.8 kg)  11/12/19 189 lb 8 oz (86 kg)  01/02/19 173 lb (78.5 kg)     GEN:  Well nourished, well developed in no acute distress HEENT: Normal NECK: No JVD; No carotid bruits LYMPHATICS: No lymphadenopathy CARDIAC: RRR, no murmurs, rubs, gallops RESPIRATORY:  Clear to auscultation without rales, wheezing or rhonchi  ABDOMEN: Soft, non-tender, non-distended MUSCULOSKELETAL:  No edema; No deformity  SKIN: Warm and dry NEUROLOGIC:  Alert and oriented x 3 PSYCHIATRIC:  Normal affect   ASSESSMENT:    1. Chest pain of uncertain etiology   2. SOB (shortness of breath)    PLAN:    In order of problems listed above:  Prior chest pain/shortness of breath -Nuclear stress test back in 2018 as well as previous to that were reassuring. --Few times felt palpitations, more so. Taking metoprol every night.  --Had stressful situation, upset at night. 2 months ago.  Chest pain, hard to breath, racing heart. Very close to calling 911. Not happened again. Tired a lot.  -We will go ahead and check a echocardiogram to ensure proper structure and function.  Is been since 2016. -If she still has continued chest pain at times we will go have a low threshold for having a coronary CT scan.  If CT scan did show  coronary calcifications, would strongly encourage statin use.  Have talked about this in the past with her.  Not interested at the time.  LDL is quite high.  Puts her at higher risk.  Palpitations -Toprol has improved these.  Thyroid medication previously adjusted and improved.  Cervical spinal stenosis -Meloxicam, warnings on NSAIDs.  Stay hydrated.  Hypothyroidism -Per primary team.     Medication Adjustments/Labs and Tests Ordered: Current medicines are reviewed at length with the patient today.  Concerns regarding medicines are outlined above.  Orders Placed This Encounter  Procedures  . EKG 12-Lead  . ECHOCARDIOGRAM COMPLETE   No orders of the defined types were placed in this encounter.   Patient Instructions  Medication Instructions:   Your physician recommends that you continue on your current medications as directed. Please refer to the Current Medication list given to you today.  *If you need a refill on your cardiac medications before your next appointment, please call your pharmacy*  Testing/Procedures:  Your physician has requested that you have an echocardiogram. Echocardiography is a painless test that uses sound waves to create images of your heart. It provides your doctor with information about the size and shape of your heart and how well your heart's chambers and valves are working. This procedure takes approximately one hour. There are no restrictions for this procedure.  Follow-Up: At [facility], you and your health needs are our priority.  As part of our continuing mission to provide you with exceptional heart care, we have created designated Provider Care Teams.  These Care Teams include your primary Cardiologist (physician) and Advanced Practice Providers (APPs -  Physician Assistants and Nurse Practitioners) who all work together to provide you with the care you need, when you need it.  We recommend signing up for the patient portal called "MyChart".   Sign up information is provided on this After Visit Summary.  MyChart is used to connect with patients for Virtual Visits (Telemedicine).  Patients are able to view lab/test results, encounter notes, upcoming appointments,  etc.  Non-urgent messages can be sent to your provider as well.   To learn more about what you can do with MyChart, go to [email or web address].    Your next appointment:   12 month(s)  The format for your next appointment:   In Person  Provider:   Candee Furbish, MD        Signed, Candee Furbish, MD  01/03/2020 9:49 AM    [place]

## 2020-01-03 NOTE — Patient Instructions (Signed)
Medication Instructions:   Your physician recommends that you continue on your current medications as directed. Please refer to the Current Medication list given to you today.  *If you need a refill on your cardiac medications before your next appointment, please call your pharmacy*  Testing/Procedures:  Your physician has requested that you have an echocardiogram. Echocardiography is a painless test that uses sound waves to create images of your heart. It provides your doctor with information about the size and shape of your heart and how well your heart's chambers and valves are working. This procedure takes approximately one hour. There are no restrictions for this procedure.  Follow-Up: At [facility], you and your health needs are our priority.  As part of our continuing mission to provide you with exceptional heart care, we have created designated Provider Care Teams.  These Care Teams include your primary Cardiologist (physician) and Advanced Practice Providers (APPs -  Physician Assistants and Nurse Practitioners) who all work together to provide you with the care you need, when you need it.  We recommend signing up for the patient portal called "MyChart".  Sign up information is provided on this After Visit Summary.  MyChart is used to connect with patients for Virtual Visits (Telemedicine).  Patients are able to view lab/test results, encounter notes, upcoming appointments, etc.  Non-urgent messages can be sent to your provider as well.   To learn more about what you can do with MyChart, go to [email or web address].    Your next appointment:   12 month(s)  The format for your next appointment:   In Person  Provider:   Candee Furbish, MD

## 2020-01-04 ENCOUNTER — Other Ambulatory Visit: Payer: Self-pay | Admitting: Cardiology

## 2020-01-04 DIAGNOSIS — R079 Chest pain, unspecified: Secondary | ICD-10-CM

## 2020-01-04 DIAGNOSIS — R002 Palpitations: Secondary | ICD-10-CM

## 2020-01-15 DIAGNOSIS — E7800 Pure hypercholesterolemia, unspecified: Secondary | ICD-10-CM | POA: Diagnosis not present

## 2020-01-15 DIAGNOSIS — E039 Hypothyroidism, unspecified: Secondary | ICD-10-CM | POA: Diagnosis not present

## 2020-01-24 ENCOUNTER — Ambulatory Visit (HOSPITAL_COMMUNITY): Payer: PPO | Attending: Cardiovascular Disease

## 2020-01-24 ENCOUNTER — Other Ambulatory Visit: Payer: Self-pay

## 2020-01-24 DIAGNOSIS — R0602 Shortness of breath: Secondary | ICD-10-CM | POA: Diagnosis not present

## 2020-01-24 DIAGNOSIS — R079 Chest pain, unspecified: Secondary | ICD-10-CM | POA: Diagnosis not present

## 2020-03-06 DIAGNOSIS — E7800 Pure hypercholesterolemia, unspecified: Secondary | ICD-10-CM | POA: Diagnosis not present

## 2020-03-06 DIAGNOSIS — E039 Hypothyroidism, unspecified: Secondary | ICD-10-CM | POA: Diagnosis not present

## 2020-04-08 DIAGNOSIS — E7800 Pure hypercholesterolemia, unspecified: Secondary | ICD-10-CM | POA: Diagnosis not present

## 2020-04-08 DIAGNOSIS — K219 Gastro-esophageal reflux disease without esophagitis: Secondary | ICD-10-CM | POA: Diagnosis not present

## 2020-04-08 DIAGNOSIS — E039 Hypothyroidism, unspecified: Secondary | ICD-10-CM | POA: Diagnosis not present

## 2020-05-02 DIAGNOSIS — K219 Gastro-esophageal reflux disease without esophagitis: Secondary | ICD-10-CM | POA: Diagnosis not present

## 2020-05-02 DIAGNOSIS — E7800 Pure hypercholesterolemia, unspecified: Secondary | ICD-10-CM | POA: Diagnosis not present

## 2020-05-02 DIAGNOSIS — E039 Hypothyroidism, unspecified: Secondary | ICD-10-CM | POA: Diagnosis not present

## 2020-05-16 DIAGNOSIS — E039 Hypothyroidism, unspecified: Secondary | ICD-10-CM | POA: Diagnosis not present

## 2020-07-07 DIAGNOSIS — E039 Hypothyroidism, unspecified: Secondary | ICD-10-CM | POA: Diagnosis not present

## 2020-07-07 DIAGNOSIS — M4802 Spinal stenosis, cervical region: Secondary | ICD-10-CM | POA: Diagnosis not present

## 2020-07-07 DIAGNOSIS — K219 Gastro-esophageal reflux disease without esophagitis: Secondary | ICD-10-CM | POA: Diagnosis not present

## 2020-07-07 DIAGNOSIS — E7800 Pure hypercholesterolemia, unspecified: Secondary | ICD-10-CM | POA: Diagnosis not present

## 2020-07-07 DIAGNOSIS — E538 Deficiency of other specified B group vitamins: Secondary | ICD-10-CM | POA: Diagnosis not present

## 2020-08-04 DIAGNOSIS — Z83511 Family history of glaucoma: Secondary | ICD-10-CM | POA: Diagnosis not present

## 2020-08-04 DIAGNOSIS — H2513 Age-related nuclear cataract, bilateral: Secondary | ICD-10-CM | POA: Diagnosis not present

## 2020-08-04 DIAGNOSIS — H40053 Ocular hypertension, bilateral: Secondary | ICD-10-CM | POA: Diagnosis not present

## 2020-08-05 DIAGNOSIS — Z8041 Family history of malignant neoplasm of ovary: Secondary | ICD-10-CM | POA: Diagnosis not present

## 2020-08-05 DIAGNOSIS — Z01419 Encounter for gynecological examination (general) (routine) without abnormal findings: Secondary | ICD-10-CM | POA: Diagnosis not present

## 2020-08-05 DIAGNOSIS — Z1231 Encounter for screening mammogram for malignant neoplasm of breast: Secondary | ICD-10-CM | POA: Diagnosis not present

## 2020-08-07 DIAGNOSIS — K219 Gastro-esophageal reflux disease without esophagitis: Secondary | ICD-10-CM | POA: Diagnosis not present

## 2020-08-07 DIAGNOSIS — E039 Hypothyroidism, unspecified: Secondary | ICD-10-CM | POA: Diagnosis not present

## 2020-08-07 DIAGNOSIS — E7800 Pure hypercholesterolemia, unspecified: Secondary | ICD-10-CM | POA: Diagnosis not present

## 2020-08-10 DIAGNOSIS — U071 COVID-19: Secondary | ICD-10-CM | POA: Diagnosis not present

## 2020-08-10 DIAGNOSIS — R12 Heartburn: Secondary | ICD-10-CM | POA: Diagnosis not present

## 2020-08-10 DIAGNOSIS — Z20822 Contact with and (suspected) exposure to covid-19: Secondary | ICD-10-CM | POA: Diagnosis not present

## 2020-08-10 DIAGNOSIS — B349 Viral infection, unspecified: Secondary | ICD-10-CM | POA: Diagnosis not present

## 2020-08-10 DIAGNOSIS — R051 Acute cough: Secondary | ICD-10-CM | POA: Diagnosis not present

## 2020-10-14 DIAGNOSIS — L814 Other melanin hyperpigmentation: Secondary | ICD-10-CM | POA: Diagnosis not present

## 2020-10-14 DIAGNOSIS — L821 Other seborrheic keratosis: Secondary | ICD-10-CM | POA: Diagnosis not present

## 2020-10-14 DIAGNOSIS — D225 Melanocytic nevi of trunk: Secondary | ICD-10-CM | POA: Diagnosis not present

## 2020-12-25 DIAGNOSIS — M1711 Unilateral primary osteoarthritis, right knee: Secondary | ICD-10-CM | POA: Diagnosis not present

## 2020-12-25 DIAGNOSIS — M1712 Unilateral primary osteoarthritis, left knee: Secondary | ICD-10-CM | POA: Diagnosis not present

## 2021-01-06 DIAGNOSIS — E039 Hypothyroidism, unspecified: Secondary | ICD-10-CM | POA: Diagnosis not present

## 2021-01-06 DIAGNOSIS — K219 Gastro-esophageal reflux disease without esophagitis: Secondary | ICD-10-CM | POA: Diagnosis not present

## 2021-01-06 DIAGNOSIS — R002 Palpitations: Secondary | ICD-10-CM | POA: Diagnosis not present

## 2021-01-06 DIAGNOSIS — R7309 Other abnormal glucose: Secondary | ICD-10-CM | POA: Diagnosis not present

## 2021-01-06 DIAGNOSIS — R300 Dysuria: Secondary | ICD-10-CM | POA: Diagnosis not present

## 2021-01-06 DIAGNOSIS — M4802 Spinal stenosis, cervical region: Secondary | ICD-10-CM | POA: Diagnosis not present

## 2021-01-06 DIAGNOSIS — R7303 Prediabetes: Secondary | ICD-10-CM | POA: Diagnosis not present

## 2021-01-06 DIAGNOSIS — K588 Other irritable bowel syndrome: Secondary | ICD-10-CM | POA: Diagnosis not present

## 2021-01-06 DIAGNOSIS — R600 Localized edema: Secondary | ICD-10-CM | POA: Diagnosis not present

## 2021-01-06 DIAGNOSIS — E7800 Pure hypercholesterolemia, unspecified: Secondary | ICD-10-CM | POA: Diagnosis not present

## 2021-01-08 DIAGNOSIS — H2513 Age-related nuclear cataract, bilateral: Secondary | ICD-10-CM | POA: Diagnosis not present

## 2021-01-08 DIAGNOSIS — Z83511 Family history of glaucoma: Secondary | ICD-10-CM | POA: Diagnosis not present

## 2021-01-08 DIAGNOSIS — H40053 Ocular hypertension, bilateral: Secondary | ICD-10-CM | POA: Diagnosis not present

## 2021-01-08 NOTE — Progress Notes (Signed)
Cardiology Office Note:    Date:  01/09/2021   ID:  Rhonda Reese, DOB [birth year], MRN [id number]  PCP:  Shirline Frees, MD   [facility] Providers Cardiologist:  Candee Furbish, MD      Referring MD: Shirline Frees, MD   Chief Complaint:  Chest Pain    Patient Profile:    Rhonda Reese is a 75 y.o. female with:   Chest pain  Myoview in 2018: low risk   Hemachromatosis   Hx of Randell Patient  Spinal stenosis   GERD  Hyperlipidemia   Hypothyroidism   Palpitations tx with beta-blocker   Monitor 2016: PACs  Prior CV studies: Echocardiogram 01/24/2020 EF 65-70, no RWMA, moderate LVH, G1 DD, normal RVSF, AV sclerosis without stenosis  GATED SPECT MYO PERF W/LEXISCAN STRESS 1D 03/09/2017 Narrative  Nuclear stress EF: 71%.  There was no ST segment deviation noted during stress.  The study is normal.  The left ventricular ejection fraction is hyperdynamic (>65%).  1. EF 71%, normal wall motion. 2. Normal study, no evidence for ischemia or infarction.   Carotid US 08/30/16 IMPRESSION: Minor carotid atherosclerosis. No hemodynamically significant ICA stenosis. Degree of narrowing less than 50% bilaterally.  CARDIAC TELEMETRY MONITORING-INTERPRETATION ONLY 04/08/2015 Narrative  PAC's, brief PAT (parox atrial tachycardia 5 beats 140 BPM)  NO atrial fibrillation  Discussed with patient in clinic.    History of Present Illness: Rhonda Reese was last seen by Dr. Marlou Porch in 5/21.  She returns for follow-up.  She is here alone.  Over the past several months, she has noted some lower left chest discomfort.  This seems to come on randomly and lasts about 20 to 30 minutes.  It feels like a tightness.  She has not really noticed any exertional symptoms or other associated symptoms.  She has not had significant shortness of breath.  She has not had orthopnea, syncope.  She does get dizzy at times and relates it to her cervical spinal stenosis.  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  She has noticed some leg swelling that improves with elevation.  She also has some bluish discoloration in her feet that resolves with elevation.        Past Medical History:  Diagnosis Date  . Chronic kidney disease    kidney stones and infection  . Diverticulosis   . Randell Patient infection   . GERD (gastroesophageal reflux disease)   . HA (headache)   . Hemochromatosis   . 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 Hyperlipidemia   . Memory loss   . Seasonal allergies   . Thyroid disease    reverse T3    Current Medications: Current Meds  Medication Sig  . ARMOUR THYROID 60 MG tablet Take 1 tablet by mouth in the morning  . B Complex Vitamins (VITAMIN B COMPLEX PO) Take by mouth. Takes combination of B1, B12, B6 & Methylfolate  . cyclobenzaprine (FLEXERIL) 5 MG tablet Take 5 mg by mouth at bedtime as needed for muscle spasms.  . famotidine (PEPCID) 40 MG tablet Take 40 mg by mouth daily.  . hydrochlorothiazide (HYDRODIURIL) 12.5 MG tablet Take 12.5 mg by mouth daily.  Marland Kitchen liothyronine (CYTOMEL) 5 MCG tablet Take 10 mcg by mouth daily.  Marland Kitchen loratadine (CLARITIN) 10 MG tablet Take 10 mg by mouth daily.  . magnesium oxide (MAG-OX) 400 MG tablet Take 400 mg by mouth daily.  . metoprolol tartrate (LOPRESSOR) 100 MG tablet  Take 1 tablet (100 mg total) by mouth as directed. 90 to 120 minutes prior to scan  . Nattokinase 100 MG CAPS Take 100 mg by mouth daily.  . valACYclovir (VALTREX) 1000 MG tablet Take 1,000 mg by mouth as needed.   Marland Kitchen VITAMIN D PO Take 1 tablet by mouth daily. 10,000 mg  . Zinc 50 MG TABS Take by mouth.  . [DISCONTINUED] metoprolol succinate (TOPROL-XL) 25 MG 24 hr tablet TAKE 1 TABLET BY MOUTH DAILY AS NEEDED FOR PALPITATIONS.   Current Facility-Administered Medications for the 01/09/21 encounter (Office Visit) with Richardson Dopp T, PA-C  Medication  . 0.9 %  sodium chloride infusion     Allergies:   Chloroquine  phosphate, Morphine and related, Prednisone, and Pedi-pre tape spray [wound dressing adhesive]   Social History   Tobacco Use  . Smoking status: Never Smoker  . Smokeless tobacco: Never Used  Vaping Use  . Vaping Use: Never used  Substance Use Topics  . Alcohol use: No  . Drug use: No     Family Hx: The patient's family history includes Diabetes in her father; Heart failure in her father; Hemochromatosis in her mother; High Cholesterol in her father; High blood pressure in her father; Ovarian cancer in her mother; Thyroid disease in her mother. There is no history of Esophageal cancer, Prostate cancer, Rectal cancer, or Colon cancer.  ROS -see HPI  EKGs/Labs/Other Test Reviewed:    EKG:  EKG is   ordered today.  The ekg ordered today demonstrates NSR, HR 83, normal axis, no ST-T wave changes, QTC 467, no change from prior tracing  Recent Labs: No results found for requested labs within last 8760 hours.   Recent Lipid Panel No results found for: CHOL, TRIG, HDL, LDLCALC, LDLDIRECT  Labs obtained through [facility] - personally reviewed and interpreted: 07/07/2020: Total cholesterol 261, HDL 52, LDL 190, triglycerides 111, Hgb 15.9, creatinine 0.68, K+ 4.7, ALT 15 01/06/2021: A1c 6.3  Risk Assessment/Calculations:      Physical Exam:    VS:  BP 130/64   Pulse 83   Ht 5\' 2"  (1.575 m)   Wt 183 lb 3.2 oz (83.1 kg)   SpO2 97%   BMI 33.51 kg/m     Wt Readings from Last 3 Encounters:  01/09/21 183 lb 3.2 oz (83.1 kg)  01/03/20 189 lb 3.2 oz (85.8 kg)  11/12/19 189 lb 8 oz (86 kg)     Constitutional:      Appearance: Healthy appearance. Not in distress.  Neck:     Vascular: JVD normal.  Pulmonary:     Effort: Pulmonary effort is normal.     Breath sounds: No wheezing. No rales.  Cardiovascular:     Normal rate. Regular rhythm. Normal S1. Normal S2.     Murmurs: There is no murmur.  Edema:    Ankle: bilateral trace edema of the ankle. Abdominal:     Palpations:  Abdomen is soft. There is no hepatomegaly.  Skin:    General: Skin is warm and dry.  Neurological:     Mental Status: Alert and oriented to person, place and time.     Cranial Nerves: Cranial nerves are intact.          ASSESSMENT & PLAN:    1. Chest pain, unspecified type She has had a long history of chest discomfort.  She had a low risk Myoview in 2018.  She has significantly elevated LDL.  Dr. Marlou Porch has considered CT scan in  the past.  I discussed with her the pros and cons of proceeding with stress testing versus coronary CTA.  I explained to her that if we did see evidence of CAD on CT, it would be important that we place her on statin therapy as well as +/- aspirin therapy.  She would like to pursue coronary CTA.  -Arrange coronary CTA  2. Palpitations Overall controlled on beta-blocker therapy.  Continue current dose of metoprolol succinate.  3. Pure hypercholesterolemia Recent LDL 190.  As noted, if CT does show CAD, she will need statin therapy.  4. Leg edema She has venous insufficiency.  I have advised elevation and compression stockings.  She does take an occasional HCTZ with relief.      Dispo:  Return in about 1 year (around 01/09/2022) for Routine Follow Up with Dr. Marlou Porch.   Medication Adjustments/Labs and Tests Ordered: Current medicines are reviewed at length with the patient today.  Concerns regarding medicines are outlined above.  Tests Ordered: Orders Placed This Encounter  Procedures  . CT CORONARY MORPH W/CTA COR W/SCORE W/CA W/CM &/OR WO/CM  . Basic Metabolic Panel (BMET)  . EKG 12-Lead   Medication Changes: Meds ordered this encounter  Medications  . metoprolol succinate (TOPROL-XL) 25 MG 24 hr tablet    Sig: One tablet by mouth ( 25 mg) daily.    Dispense:  90 tablet    Refill:  3  . metoprolol tartrate (LOPRESSOR) 100 MG tablet    Sig: Take 1 tablet (100 mg total) by mouth as directed. 90 to 120 minutes prior to scan    Dispense:  1 tablet     Refill:  0    Signed, Richardson Dopp, PA-C  01/09/2021 12:58 PM    [facility] [place], [place], [place]  [address] Phone: [phone]; Fax: [phone]

## 2021-01-09 ENCOUNTER — Ambulatory Visit: Payer: PPO | Admitting: Physician Assistant

## 2021-01-09 ENCOUNTER — Encounter: Payer: Self-pay | Admitting: Physician Assistant

## 2021-01-09 ENCOUNTER — Other Ambulatory Visit: Payer: Self-pay

## 2021-01-09 VITALS — BP 130/64 | HR 83 | Ht 62.0 in | Wt 183.2 lb

## 2021-01-09 DIAGNOSIS — E785 Hyperlipidemia, unspecified: Secondary | ICD-10-CM

## 2021-01-09 DIAGNOSIS — R002 Palpitations: Secondary | ICD-10-CM

## 2021-01-09 DIAGNOSIS — R600 Localized edema: Secondary | ICD-10-CM

## 2021-01-09 DIAGNOSIS — E7800 Pure hypercholesterolemia, unspecified: Secondary | ICD-10-CM

## 2021-01-09 DIAGNOSIS — R079 Chest pain, unspecified: Secondary | ICD-10-CM

## 2021-01-09 MED ORDER — METOPROLOL SUCCINATE ER 25 MG PO TB24: ORAL_TABLET | ORAL | 3 refills | Status: AC

## 2021-01-09 MED ORDER — METOPROLOL TARTRATE 100 MG PO TABS: 100.0000 mg | ORAL_TABLET | ORAL | 0 refills | Status: AC

## 2021-01-09 NOTE — Patient Instructions (Signed)
Medication Instructions:  Your physician recommends that you continue on your current medications as directed. Please refer to the Current Medication list given to you today.  *If you need a refill on your cardiac medications before your next appointment, please call your pharmacy*   Lab Work: Will call patient when to come in for lab work  If you have labs (blood work) drawn today and your tests are completely normal, you will receive your results only by: Marland Kitchen MyChart Message (if you have MyChart) OR . A paper copy in the mail If you have any lab test that is abnormal or we need to change your treatment, we will call you to review the results.   Testing/Procedures: See below   Follow-Up: At [facility], you and your health needs are our priority.  As part of our continuing mission to provide you with exceptional heart care, we have created designated Provider Care Teams.  These Care Teams include your primary Cardiologist (physician) and Advanced Practice Providers (APPs -  Physician Assistants and Nurse Practitioners) who all work together to provide you with the care you need, when you need it.  We recommend signing up for the patient portal called "MyChart".  Sign up information is provided on this After Visit Summary.  MyChart is used to connect with patients for Virtual Visits (Telemedicine).  Patients are able to view lab/test results, encounter notes, upcoming appointments, etc.  Non-urgent messages can be sent to your provider as well.   To learn more about what you can do with MyChart, go to [email or web address].    Your next appointment:   1 year(s)  The format for your next appointment:   In Person  Provider:   Candee Furbish, MD   Your physician wants you to follow-up in: 1 year with Dr. Marlou Porch.  You will receive a reminder letter in the mail two months in advance. If you don't receive a letter, please call our office to schedule the follow-up appointment.    Other  Instructions Your cardiac CT will be scheduled at one of the below locations:   [facility] [address] [place], [place] [address] [phone]  If scheduled at [facility], please arrive at the [facility] main entrance (entrance A) of [facility] 30 minutes prior to test start time. Proceed to the [facility] Radiology Department (first floor) to check-in and test prep.  Please follow these instructions carefully (unless otherwise directed):  On the Night Before the Test: . Be sure to Drink plenty of water. . Do not consume any caffeinated/decaffeinated beverages or chocolate 12 hours prior to your test. . Do not take any antihistamines 12 hours prior to your test.  On the Day of the Test: . Drink plenty of water until 1 hour prior to the test. . Do not eat any food 4 hours prior to the test. . You may take your regular medications prior to the test.  . Take metoprolol (Lopressor) two hours prior to test. One time dose.  Marland Kitchen HOLD Hydrochlorothiazide morning of the test. . FEMALES- please wear underwire-free bra if available   After the Test: . Drink plenty of water. . After receiving IV contrast, you may experience a mild flushed feeling. This is normal. . On occasion, you may experience a mild rash up to 24 hours after the test. This is not dangerous. If this occurs, you can take Benadryl 25 mg and increase your fluid intake. . If you experience trouble  breathing, this can be serious. If it is severe call 911 IMMEDIATELY. If it is mild, please call our office.  Once we have confirmed authorization from your insurance company, we will call you to set up a date and time for your test. Based on how quickly your insurance processes prior authorizations requests, please allow up to 4 weeks to be contacted for scheduling your Cardiac CT appointment. Be advised that routine Cardiac CT appointments could be scheduled as many as 8 weeks after your provider has  ordered it.  For non-scheduling related questions, please contact the cardiac imaging nurse navigator should you have any questions/concerns: Marchia Bond, Cardiac Imaging Nurse Navigator Gordy Clement, Cardiac Imaging Nurse Navigator [facility] Direct Office Dial: [phone]   For scheduling needs, including cancellations and rescheduling, please call [place], [phone].

## 2021-01-12 ENCOUNTER — Telehealth: Payer: Self-pay | Admitting: *Deleted

## 2021-01-12 ENCOUNTER — Other Ambulatory Visit: Payer: Self-pay | Admitting: *Deleted

## 2021-01-12 ENCOUNTER — Telehealth: Payer: Self-pay | Admitting: Cardiology

## 2021-01-12 ENCOUNTER — Other Ambulatory Visit: Payer: Self-pay

## 2021-01-12 DIAGNOSIS — R079 Chest pain, unspecified: Secondary | ICD-10-CM

## 2021-01-12 NOTE — Telephone Encounter (Signed)
lvm for pt to send mychart message when pt wants to come in this week for bmet 1 week prior to CTA.  Orders placed in system.  Lab appt needs to be made.

## 2021-01-12 NOTE — Telephone Encounter (Signed)
S/w pt put order in system and released for a bmet at [facility].  Pt will get labs drawn this week prior to CT.

## 2021-01-12 NOTE — Telephone Encounter (Signed)
-----   Message from Melony Overly sent at 01/09/2021  4:19 PM EDT ----- Regarding: ct heart Scheduled 01/19/21 at 12:15   She will need labs done.  Thanks, [place]

## 2021-01-12 NOTE — Telephone Encounter (Signed)
New message:  Patient calling to see if she can get lab orders to be sent some where else. She doesn't live in [place].

## 2021-01-15 DIAGNOSIS — R079 Chest pain, unspecified: Secondary | ICD-10-CM | POA: Diagnosis not present

## 2021-01-16 ENCOUNTER — Telehealth (HOSPITAL_COMMUNITY): Payer: Self-pay | Admitting: Emergency Medicine

## 2021-01-16 DIAGNOSIS — R079 Chest pain, unspecified: Secondary | ICD-10-CM

## 2021-01-16 LAB — BASIC METABOLIC PANEL
BUN/Creatinine Ratio: 16 (ref 12–28)
BUN: 13 mg/dL (ref 8–27)
CO2: 27 mmol/L (ref 20–29)
Calcium: 9.7 mg/dL (ref 8.7–10.3)
Chloride: 103 mmol/L (ref 96–106)
Creatinine, Ser: 0.79 mg/dL (ref 0.57–1.00)
Glucose: 83 mg/dL (ref 65–99)
Potassium: 5.1 mmol/L (ref 3.5–5.2)
Sodium: 143 mmol/L (ref 134–144)
eGFR: 79 mL/min/{1.73_m2} (ref >59)

## 2021-01-16 MED ORDER — METOPROLOL TARTRATE 100 MG PO TABS: 100.0000 mg | ORAL_TABLET | Freq: Once | ORAL | 0 refills | Status: AC

## 2021-01-16 NOTE — Telephone Encounter (Signed)
Pt returning phone call regarding upcoming cardiac imaging study; pt verbalizes understanding of appt date/time, parking situation and where to check in, pre-test NPO status and medications ordered, and verified current allergies; name and call back number provided for further questions should they arise Marchia Bond RN Navigator Cardiac Imaging [facility] [phone] office [phone] cell  100mg  metoprolol tart 2 hr pta

## 2021-01-16 NOTE — Telephone Encounter (Signed)
Attempted to call patient regarding upcoming cardiac CT appointment. °Left message on voicemail with name and callback number °Laqueena Hinchey RN Navigator Cardiac Imaging °[facility] °[phone] Office °[phone] Cell ° °

## 2021-01-16 NOTE — Telephone Encounter (Signed)
Attempted to call patient regarding upcoming cardiac CT appointment. °Left message on voicemail with name and callback number °Luann Aspinwall RN Navigator Cardiac Imaging °[facility] °[phone] Office °[phone] Cell ° °

## 2021-01-19 ENCOUNTER — Other Ambulatory Visit: Payer: Self-pay

## 2021-01-19 ENCOUNTER — Ambulatory Visit (HOSPITAL_COMMUNITY)
Admission: RE | Admit: 2021-01-19 | Discharge: 2021-01-19 | Disposition: A | Discharge status: Home / Self Care | Payer: PPO | Source: Ambulatory Visit | Attending: Physician Assistant | Admitting: Physician Assistant

## 2021-01-19 DIAGNOSIS — R079 Chest pain, unspecified: Secondary | ICD-10-CM | POA: Diagnosis not present

## 2021-01-19 DIAGNOSIS — I2584 Coronary atherosclerosis due to calcified coronary lesion: Secondary | ICD-10-CM | POA: Diagnosis not present

## 2021-01-19 DIAGNOSIS — I2510 Atherosclerotic heart disease of native coronary artery without angina pectoris: Secondary | ICD-10-CM | POA: Diagnosis not present

## 2021-01-19 MED ORDER — IOHEXOL 350 MG/ML SOLN
100.0000 mL | Freq: Once | INTRAVENOUS | Status: AC | PRN
  Administered 2021-01-19: 100 mL via INTRAVENOUS

## 2021-01-19 MED ORDER — METOPROLOL TARTRATE 5 MG/5ML IV SOLN
INTRAVENOUS | Status: AC
  Administered 2021-01-19: 10 mg via INTRAVENOUS
  Filled 2021-01-19: qty 10

## 2021-01-19 MED ORDER — NITROGLYCERIN 0.4 MG SL SUBL
SUBLINGUAL_TABLET | SUBLINGUAL | Status: AC
  Filled 2021-01-19: qty 2

## 2021-01-19 MED ORDER — METOPROLOL TARTRATE 5 MG/5ML IV SOLN: 10.0000 mg | INTRAVENOUS | Status: DC | PRN

## 2021-01-19 MED ORDER — NITROGLYCERIN 0.4 MG SL SUBL
0.8000 mg | SUBLINGUAL_TABLET | Freq: Once | SUBLINGUAL | Status: AC
  Administered 2021-01-19: 0.8 mg via SUBLINGUAL

## 2021-01-20 ENCOUNTER — Other Ambulatory Visit: Payer: Self-pay | Admitting: *Deleted

## 2021-01-20 ENCOUNTER — Ambulatory Visit (HOSPITAL_COMMUNITY)
Admission: RE | Admit: 2021-01-20 | Discharge: 2021-01-20 | Disposition: A | Discharge status: Home / Self Care | Payer: PPO | Source: Ambulatory Visit | Attending: Cardiology | Admitting: Cardiology

## 2021-01-20 ENCOUNTER — Encounter: Payer: Self-pay | Admitting: Physician Assistant

## 2021-01-20 ENCOUNTER — Other Ambulatory Visit (HOSPITAL_COMMUNITY): Payer: Self-pay | Admitting: Emergency Medicine

## 2021-01-20 DIAGNOSIS — R931 Abnormal findings on diagnostic imaging of heart and coronary circulation: Secondary | ICD-10-CM

## 2021-01-20 DIAGNOSIS — E7800 Pure hypercholesterolemia, unspecified: Secondary | ICD-10-CM

## 2021-01-20 DIAGNOSIS — R079 Chest pain, unspecified: Secondary | ICD-10-CM | POA: Insufficient documentation

## 2021-01-20 DIAGNOSIS — R911 Solitary pulmonary nodule: Secondary | ICD-10-CM | POA: Insufficient documentation

## 2021-01-20 DIAGNOSIS — I2510 Atherosclerotic heart disease of native coronary artery without angina pectoris: Secondary | ICD-10-CM | POA: Insufficient documentation

## 2021-01-20 MED ORDER — ROSUVASTATIN CALCIUM 20 MG PO TABS: 20.0000 mg | ORAL_TABLET | Freq: Every day | ORAL | 3 refills | Status: DC

## 2021-01-20 NOTE — Progress Notes (Signed)
FFR orders added

## 2021-02-05 DIAGNOSIS — E039 Hypothyroidism, unspecified: Secondary | ICD-10-CM | POA: Diagnosis not present

## 2021-02-05 DIAGNOSIS — E7800 Pure hypercholesterolemia, unspecified: Secondary | ICD-10-CM | POA: Diagnosis not present

## 2021-02-05 DIAGNOSIS — K219 Gastro-esophageal reflux disease without esophagitis: Secondary | ICD-10-CM | POA: Diagnosis not present

## 2021-03-10 DIAGNOSIS — E049 Nontoxic goiter, unspecified: Secondary | ICD-10-CM | POA: Diagnosis not present

## 2021-03-10 DIAGNOSIS — Z0000 Encounter for general adult medical examination without abnormal findings: Secondary | ICD-10-CM | POA: Diagnosis not present

## 2021-03-10 DIAGNOSIS — E669 Obesity, unspecified: Secondary | ICD-10-CM | POA: Diagnosis not present

## 2021-03-10 DIAGNOSIS — M4800 Spinal stenosis, site unspecified: Secondary | ICD-10-CM | POA: Diagnosis not present

## 2021-03-10 DIAGNOSIS — E785 Hyperlipidemia, unspecified: Secondary | ICD-10-CM | POA: Diagnosis not present

## 2021-03-10 DIAGNOSIS — Z79899 Other long term (current) drug therapy: Secondary | ICD-10-CM | POA: Diagnosis not present

## 2021-03-10 DIAGNOSIS — R931 Abnormal findings on diagnostic imaging of heart and coronary circulation: Secondary | ICD-10-CM | POA: Diagnosis not present

## 2021-03-10 DIAGNOSIS — R911 Solitary pulmonary nodule: Secondary | ICD-10-CM | POA: Diagnosis not present

## 2021-03-10 DIAGNOSIS — I10 Essential (primary) hypertension: Secondary | ICD-10-CM | POA: Diagnosis not present

## 2021-03-10 DIAGNOSIS — E559 Vitamin D deficiency, unspecified: Secondary | ICD-10-CM | POA: Diagnosis not present

## 2021-03-10 DIAGNOSIS — K219 Gastro-esophageal reflux disease without esophagitis: Secondary | ICD-10-CM | POA: Diagnosis not present

## 2021-03-16 DIAGNOSIS — M4800 Spinal stenosis, site unspecified: Secondary | ICD-10-CM | POA: Diagnosis not present

## 2021-03-16 DIAGNOSIS — E785 Hyperlipidemia, unspecified: Secondary | ICD-10-CM | POA: Diagnosis not present

## 2021-03-16 DIAGNOSIS — E669 Obesity, unspecified: Secondary | ICD-10-CM | POA: Diagnosis not present

## 2021-03-16 DIAGNOSIS — I10 Essential (primary) hypertension: Secondary | ICD-10-CM | POA: Diagnosis not present

## 2021-03-16 DIAGNOSIS — E042 Nontoxic multinodular goiter: Secondary | ICD-10-CM | POA: Diagnosis not present

## 2021-03-16 DIAGNOSIS — E049 Nontoxic goiter, unspecified: Secondary | ICD-10-CM | POA: Diagnosis not present

## 2021-03-16 DIAGNOSIS — R911 Solitary pulmonary nodule: Secondary | ICD-10-CM | POA: Diagnosis not present

## 2021-03-16 DIAGNOSIS — Z6834 Body mass index (BMI) 34.0-34.9, adult: Secondary | ICD-10-CM | POA: Diagnosis not present

## 2021-03-16 DIAGNOSIS — R931 Abnormal findings on diagnostic imaging of heart and coronary circulation: Secondary | ICD-10-CM | POA: Diagnosis not present

## 2021-03-16 DIAGNOSIS — K219 Gastro-esophageal reflux disease without esophagitis: Secondary | ICD-10-CM | POA: Diagnosis not present

## 2021-03-18 DIAGNOSIS — R7303 Prediabetes: Secondary | ICD-10-CM | POA: Diagnosis not present

## 2021-03-18 DIAGNOSIS — R931 Abnormal findings on diagnostic imaging of heart and coronary circulation: Secondary | ICD-10-CM | POA: Diagnosis not present

## 2021-03-18 DIAGNOSIS — E785 Hyperlipidemia, unspecified: Secondary | ICD-10-CM | POA: Diagnosis not present

## 2021-04-14 DIAGNOSIS — E785 Hyperlipidemia, unspecified: Secondary | ICD-10-CM | POA: Diagnosis not present

## 2021-04-14 DIAGNOSIS — R7303 Prediabetes: Secondary | ICD-10-CM | POA: Diagnosis not present

## 2021-04-14 DIAGNOSIS — I10 Essential (primary) hypertension: Secondary | ICD-10-CM | POA: Diagnosis not present

## 2021-04-14 DIAGNOSIS — G479 Sleep disorder, unspecified: Secondary | ICD-10-CM | POA: Diagnosis not present

## 2021-04-24 DIAGNOSIS — I10 Essential (primary) hypertension: Secondary | ICD-10-CM | POA: Diagnosis not present

## 2021-04-24 DIAGNOSIS — E785 Hyperlipidemia, unspecified: Secondary | ICD-10-CM | POA: Diagnosis not present

## 2021-04-24 DIAGNOSIS — R931 Abnormal findings on diagnostic imaging of heart and coronary circulation: Secondary | ICD-10-CM | POA: Diagnosis not present

## 2021-04-24 DIAGNOSIS — I2510 Atherosclerotic heart disease of native coronary artery without angina pectoris: Secondary | ICD-10-CM | POA: Diagnosis not present

## 2021-04-28 DIAGNOSIS — M1712 Unilateral primary osteoarthritis, left knee: Secondary | ICD-10-CM | POA: Diagnosis not present

## 2021-05-15 DIAGNOSIS — R079 Chest pain, unspecified: Secondary | ICD-10-CM | POA: Diagnosis not present

## 2021-05-15 DIAGNOSIS — R931 Abnormal findings on diagnostic imaging of heart and coronary circulation: Secondary | ICD-10-CM | POA: Diagnosis not present

## 2021-05-15 DIAGNOSIS — I10 Essential (primary) hypertension: Secondary | ICD-10-CM | POA: Diagnosis not present

## 2021-05-20 DIAGNOSIS — K219 Gastro-esophageal reflux disease without esophagitis: Secondary | ICD-10-CM | POA: Diagnosis not present

## 2021-05-20 DIAGNOSIS — I10 Essential (primary) hypertension: Secondary | ICD-10-CM | POA: Diagnosis not present

## 2021-05-20 DIAGNOSIS — U071 COVID-19: Secondary | ICD-10-CM | POA: Diagnosis not present

## 2021-05-20 DIAGNOSIS — R509 Fever, unspecified: Secondary | ICD-10-CM | POA: Diagnosis not present

## 2021-05-20 DIAGNOSIS — E7800 Pure hypercholesterolemia, unspecified: Secondary | ICD-10-CM | POA: Diagnosis not present

## 2021-05-20 DIAGNOSIS — H6123 Impacted cerumen, bilateral: Secondary | ICD-10-CM | POA: Diagnosis not present

## 2021-05-22 ENCOUNTER — Ambulatory Visit: Payer: PPO | Admitting: Cardiology

## 2021-05-30 DIAGNOSIS — N390 Urinary tract infection, site not specified: Secondary | ICD-10-CM | POA: Diagnosis not present

## 2021-05-30 DIAGNOSIS — R350 Frequency of micturition: Secondary | ICD-10-CM | POA: Diagnosis not present

## 2021-06-22 DIAGNOSIS — E039 Hypothyroidism, unspecified: Secondary | ICD-10-CM | POA: Diagnosis not present

## 2021-06-22 DIAGNOSIS — R079 Chest pain, unspecified: Secondary | ICD-10-CM | POA: Diagnosis not present

## 2021-06-22 DIAGNOSIS — E785 Hyperlipidemia, unspecified: Secondary | ICD-10-CM | POA: Diagnosis not present

## 2021-06-22 DIAGNOSIS — R7303 Prediabetes: Secondary | ICD-10-CM | POA: Diagnosis not present

## 2021-06-25 DIAGNOSIS — I10 Essential (primary) hypertension: Secondary | ICD-10-CM | POA: Diagnosis not present

## 2021-06-25 DIAGNOSIS — R0789 Other chest pain: Secondary | ICD-10-CM | POA: Diagnosis not present

## 2021-06-25 DIAGNOSIS — I2510 Atherosclerotic heart disease of native coronary artery without angina pectoris: Secondary | ICD-10-CM | POA: Diagnosis not present

## 2021-06-25 DIAGNOSIS — R002 Palpitations: Secondary | ICD-10-CM | POA: Diagnosis not present

## 2021-07-07 DIAGNOSIS — E063 Autoimmune thyroiditis: Secondary | ICD-10-CM | POA: Diagnosis not present

## 2021-07-07 DIAGNOSIS — E042 Nontoxic multinodular goiter: Secondary | ICD-10-CM | POA: Diagnosis not present

## 2021-07-07 DIAGNOSIS — E039 Hypothyroidism, unspecified: Secondary | ICD-10-CM | POA: Diagnosis not present

## 2021-07-07 DIAGNOSIS — R7303 Prediabetes: Secondary | ICD-10-CM | POA: Diagnosis not present

## 2021-08-02 DIAGNOSIS — R002 Palpitations: Secondary | ICD-10-CM | POA: Diagnosis not present

## 2022-01-20 ENCOUNTER — Other Ambulatory Visit: Payer: PPO

## 2022-01-27 ENCOUNTER — Other Ambulatory Visit: Payer: Self-pay | Admitting: Physician Assistant

## 2022-08-23 ENCOUNTER — Encounter: Payer: Self-pay | Admitting: Gastroenterology

## 2022-09-10 ENCOUNTER — Other Ambulatory Visit: Payer: Self-pay | Admitting: Physician Assistant

## 2022-11-10 ENCOUNTER — Other Ambulatory Visit: Payer: Self-pay | Admitting: Cardiology

## 2022-12-08 ENCOUNTER — Other Ambulatory Visit: Payer: Self-pay | Admitting: Cardiology

## 2023-01-02 ENCOUNTER — Other Ambulatory Visit: Payer: Self-pay | Admitting: Cardiology

## 2023-02-26 IMAGING — CT CT HEART MORP W/ CTA COR W/ SCORE W/ CA W/CM &/OR W/O CM
4 of 7 series · 8 of 20 positions shown, 9 images · IV contrast (APPLIED)
Comparison: None.
COMPARISON: None.

Addendum:
EXAM:
OVER-READ INTERPRETATION  CT CHEST

The following report is an over-read performed by radiologist Dr.
Dimitry Cherian [REDACTED] on 01/19/2021. This
over-read does not include interpretation of cardiac or coronary
anatomy or pathology. The coronary calcium score/coronary CTA
interpretation by the cardiologist is attached.
CLINICAL DATA: Chest pain
Cardiac CTA
MEDICATIONS:
Sub lingual nitro. 4mg x 2
TECHNIQUE: The patient was scanned on a Siemens [REDACTED]ice scanner. Gantry
rotation speed was 250 msecs. Collimation was 0.6 mm. A 100 kV
prospective scan was triggered in the ascending thoracic aorta at
35-75% of the R-R interval. Average HR during the scan was 60 bpm.
The 3D data set was interpreted on a dedicated work station using
MPR, MIP and VRT modes. A total of 80cc of contrast was used.

[Series 7: best diast 76 % · axial · 0.39mm/px · z∈[+1292,+1330]mm · 2 of 283 slices shown, 3 images]
[im 95/283  vessel]
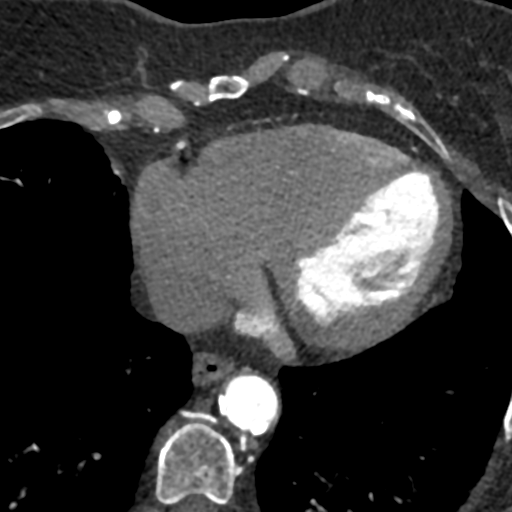
[im 95/283  lung]
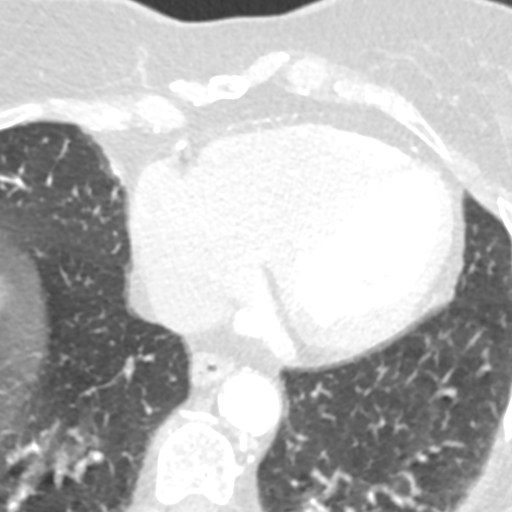
[im 189/283  vessel]
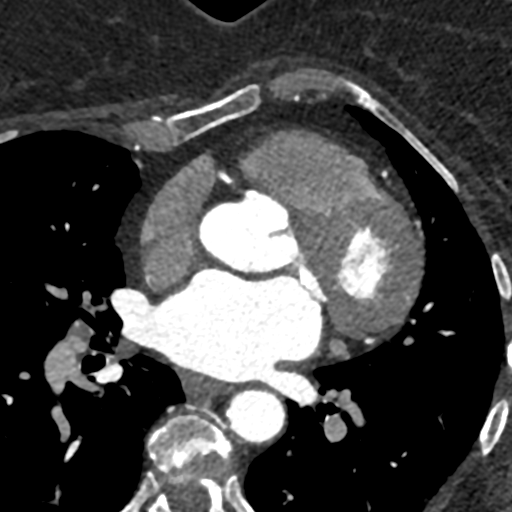

[Series 8: best syst 45 % · axial · 0.39mm/px · z∈[+1292,+1330]mm · 2 of 283 slices shown]
[im 95/283  vessel]
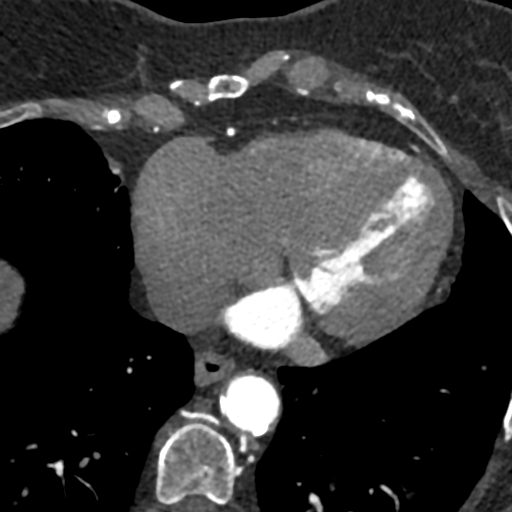
[im 189/283  vessel]
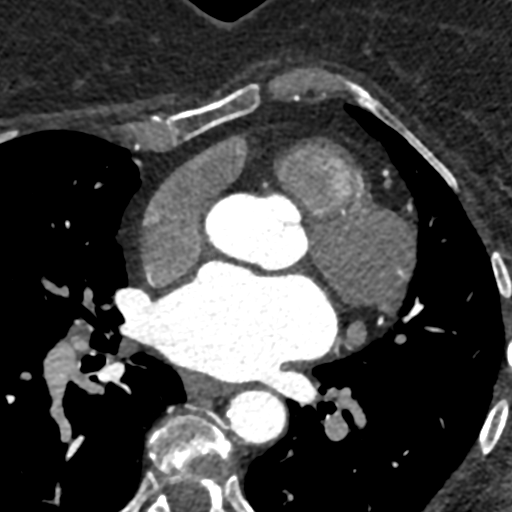

[Series 9: ts diast sharp 76 % · axial · 0.39mm/px · z∈[+1292,+1330]mm · 2 of 283 slices shown]
[im 95/283  lung]
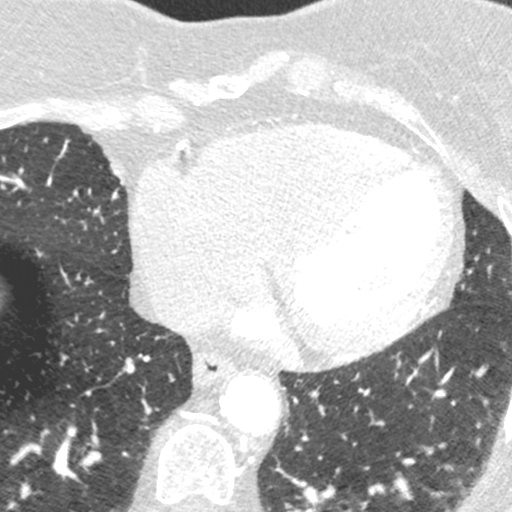
[im 189/283  lung]
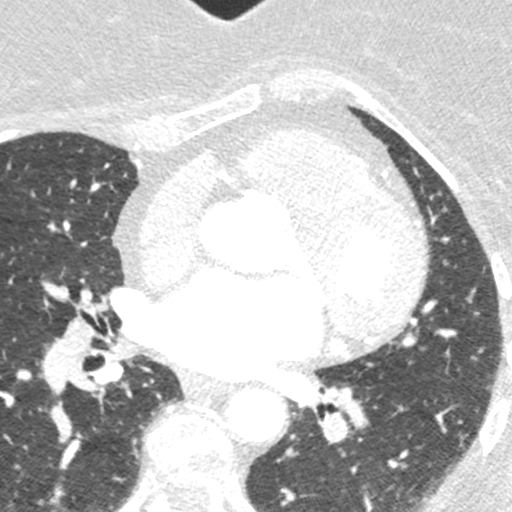

[Series 10: ts syst sharp 45 % · axial · 0.39mm/px · z∈[+1292,+1330]mm · 2 of 283 slices shown]
[im 95/283  lung]
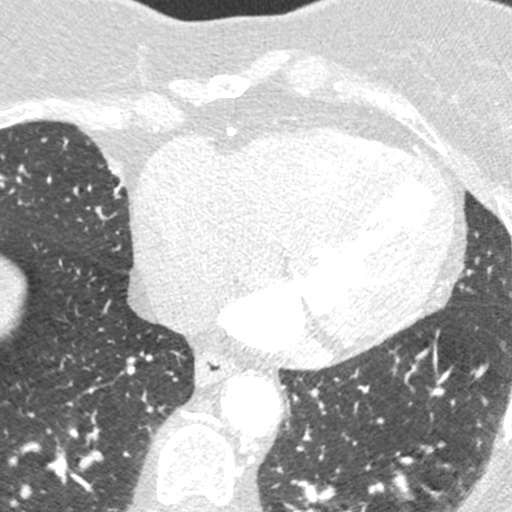
[im 189/283  lung]
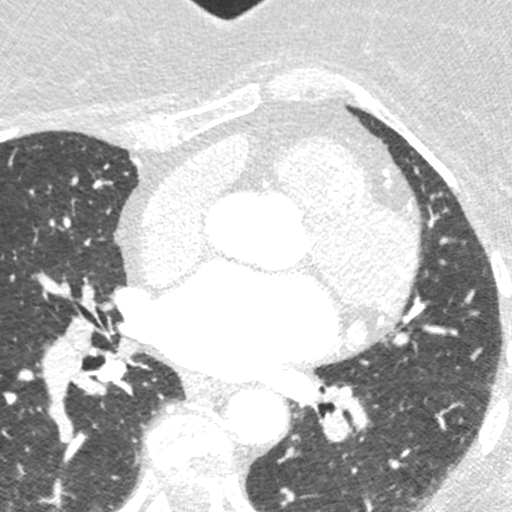

[8 of 20 positions shown; findings below may reference images not displayed]

FINDINGS: Atherosclerotic calcifications in the thoracic aorta. 3 mm right
middle lobe pulmonary nodule (axial image 22 of series 12). Within
the visualized portions of the thorax there are no suspicious
appearing pulmonary nodules or masses, there is no acute
consolidative airspace disease, no pleural effusions, no
pneumothorax and no lymphadenopathy. Visualized portions of the
upper abdomen are unremarkable. There are no aggressive appearing
lytic or blastic lesions noted in the visualized portions of the
skeleton.
IMPRESSION: 1. 3 mm right middle lobe pulmonary nodule, nonspecific, but
statistically likely benign. No follow-up needed if patient is
low-risk. Non-contrast chest CT can be considered in 12 months if
patient is high-risk. This recommendation follows the consensus
statement: Guidelines for Management of Incidental Pulmonary Nodules
Detected on CT Images: From the [HOSPITAL] 4405; Radiology
2.  Aortic Atherosclerosis (PSEDR-FOM.M).
FINDINGS: Non-cardiac: See separate report from [REDACTED].

No LA appendage thrombus. Pulmonary veins drained normally to the
left atrium.

Calcium Score: 541 Agatston units.

Coronary Arteries: Right dominant with no anomalies

LM: Mixed plaque distal left main, mild (<50%) stenosis.

LAD system: Mixed plaque in the proximal LAD, possible moderate
(51-69%) stenosis. FFR 0.87 in the mid LAD, so the proximal LAD
disease does not appear hemodynamically significant.

Circumflex system: Calcified plaque proximally with mild (<50%)
stenosis. Large OM1 with mixed plaque proximally, suspect moderate
(51-69%) stenosis. FFR 0.81 in the OM1, suspect not hemodynamically
significant. Small AV LCx distal to OM1 without significant disease.

RCA system: Mixed plaque proximal RCA with mild (<50%) stenosis.
IMPRESSION: 1. Coronary artery calcium score 541 Agatston units. This places the
patient in the 89th percentile for age and gender, suggesting high
risk for future cardiac events.

2. Moderate-appearing stenosis in the proximal LAD and the proximal
OM1, but neither area appears to be hemodynamically significant by
FFR.

Balall Rassel

*** End of Addendum ***
EXAM:
OVER-READ INTERPRETATION  CT CHEST

The following report is an over-read performed by radiologist Dr.
Dimitry Cherian [REDACTED] on 01/19/2021. This
over-read does not include interpretation of cardiac or coronary
anatomy or pathology. The coronary calcium score/coronary CTA
interpretation by the cardiologist is attached.
FINDINGS: Atherosclerotic calcifications in the thoracic aorta. 3 mm right
middle lobe pulmonary nodule (axial image 22 of series 12). Within
the visualized portions of the thorax there are no suspicious
appearing pulmonary nodules or masses, there is no acute
consolidative airspace disease, no pleural effusions, no
pneumothorax and no lymphadenopathy. Visualized portions of the
upper abdomen are unremarkable. There are no aggressive appearing
lytic or blastic lesions noted in the visualized portions of the
skeleton.
IMPRESSION: 1. 3 mm right middle lobe pulmonary nodule, nonspecific, but
statistically likely benign. No follow-up needed if patient is
low-risk. Non-contrast chest CT can be considered in 12 months if
patient is high-risk. This recommendation follows the consensus
statement: Guidelines for Management of Incidental Pulmonary Nodules
Detected on CT Images: From the [HOSPITAL] 4405; Radiology
2.  Aortic Atherosclerosis (PSEDR-FOM.M).

## 2023-03-02 ENCOUNTER — Other Ambulatory Visit: Payer: Self-pay
